# Patient Record
Sex: Female | Born: 1990
Health system: Southern US, Academic
[De-identification: ages and names within clinical notes are randomized; demographics above are authoritative.]

## PROBLEM LIST (undated history)

## (undated) ENCOUNTER — Encounter

## (undated) ENCOUNTER — Inpatient Hospital Stay (HOSPITAL_COMMUNITY): Payer: Self-pay

## (undated) DIAGNOSIS — Z789 Other specified health status: Secondary | ICD-10-CM

## (undated) HISTORY — PX: NO PAST SURGERIES: SHX2092

---

## 2016-02-16 ENCOUNTER — Ambulatory Visit: Admit: 2016-02-16 | Discharge: 2016-02-16 | Attending: Obstetrics & Gynecology

## 2016-02-16 DIAGNOSIS — Z3201 Encounter for pregnancy test, result positive: Secondary | ICD-10-CM

## 2016-02-16 DIAGNOSIS — Z3A00 Weeks of gestation of pregnancy not specified: Principal | ICD-10-CM

## 2016-02-16 DIAGNOSIS — Z3A23 23 weeks gestation of pregnancy: Secondary | ICD-10-CM

## 2016-02-16 NOTE — Progress Notes
Department of Obstetrics & Gynecology    NEW OB WORKUP     Name: Rachael Roberts   MRN: [id number]   DOB:  1991/01/28   Date of Service: 02/16/2016     Payor: [employer] / Plan: [facility]ICAID [facility] / Product Type: HMO/POS /     Reason for Visit:  Initial OB Workup    HPI: Rachael BraveRebecca Anne Roberts is a 25 y.o. female who is being seen today for her Initial OB Nurse Visit.    Urine Pregnancy Test:  Positive    LMP: Patient's last menstrual period was 09/05/2015.    EGA:  1821w3d    Labs sent to:  [facility]    Allergies:  Pcn [penicillins]    Vitals:  BP 119/60 - Pulse 76 - Temp 36.8 ?C (98.3 ?F) - Resp 18 - Ht 1.753 m (5\' 9" ) - Wt 123 kg (271 lb 1.6 oz) - LMP 09/05/2015 - BMI 40.03 kg/m2      OB forms given to patient:   Health Start Form:  yes  NICA Form has been read, signed and is complete:  yes      Loma Sousahristina Tootle, MA

## 2016-02-16 NOTE — Progress Notes
LABS DRAWN    Name: Rachael BraveRebecca Anne Roberts   MRN: [id number]   Date of Service: 02/16/2016     Payor: MEDICAID [facility] / Plan: [facility]ICAID [facility] / Product Type: HMO/POS /     Reason for Visit:   Chief Complaint   Patient presents with   ? Initial Prenatal Visit   ? Pregnancy Test       HPI:  Rachael Roberts was seen today for initial prenatal visit and pregnancy test.    Diagnoses and all orders for this visit:    Weeks of gestation of pregnancy not specified  -     ABO/Rh; Future  -     Antibody screen; Future  -     CBC and Differential; Future  -     Hepatitis B Surface Antigen; Future  -     Culture, Urine; Future  -     Hepatitis C Antibody; Future  -     HIV 1/2 ANTIGEN/ANTIBODY,4TH GEN W/REFLX; Future  -     RPR; Future  -     Rubella Ab IGG; Future  -     Sickle Cell Screen; Future  -     Urinalysis W Microscopy; Future  -     POCT Urine Pregnancy        Loma Sousahristina Tootle, MA

## 2016-02-16 NOTE — Progress Notes
Labs only.  I was on the premises and available.    No teaching statement needed.

## 2016-03-09 ENCOUNTER — Encounter: Attending: Advanced Practice Midwife

## 2016-04-24 NOTE — L&D Delivery Note (Signed)
26 y.o. [id number] at [redacted]w[redacted]d delivered a viable female infant in cephalic, ROA position. No nuchal cord. Left anterior shoulder delivered with ease. 60 sec delayed cord clamping. Cord clamped x2 and cut. Placenta delivered spontaneously intact, with 3VC. Fundus firm on exam with massage and pitocin. Good hemostasis noted.  Anesthesia: Epidural Laceration: None Suture: N/A Good hemostasis noted. EBL: 150cc  Mom and baby recovering in LDR.    Apgars:  APGAR (1 MIN): 8 APGAR (5 MINS): 9    Weight: Pending skin to skin    Jen Mow, DO OB Fellow [facility] for [employer], [facility] 08/11/2016, 11:25 AM

## 2016-06-22 ENCOUNTER — Inpatient Hospital Stay (HOSPITAL_COMMUNITY)
Admission: EM | Admit: 2016-06-22 | Discharge: 2016-06-23 | Disposition: A | Discharge status: Other Acute Inpatient Hospital | Payer: Medicaid - Out of State | Attending: Obstetrics & Gynecology | Admitting: Obstetrics & Gynecology

## 2016-06-22 ENCOUNTER — Encounter (HOSPITAL_COMMUNITY): Payer: Self-pay

## 2016-06-22 DIAGNOSIS — O99333 Smoking (tobacco) complicating pregnancy, third trimester: Secondary | ICD-10-CM | POA: Insufficient documentation

## 2016-06-22 DIAGNOSIS — Y929 Unspecified place or not applicable: Secondary | ICD-10-CM | POA: Insufficient documentation

## 2016-06-22 DIAGNOSIS — N939 Abnormal uterine and vaginal bleeding, unspecified: Secondary | ICD-10-CM

## 2016-06-22 DIAGNOSIS — X58XXXA Exposure to other specified factors, initial encounter: Secondary | ICD-10-CM | POA: Insufficient documentation

## 2016-06-22 DIAGNOSIS — Z3A33 33 weeks gestation of pregnancy: Secondary | ICD-10-CM | POA: Diagnosis not present

## 2016-06-22 DIAGNOSIS — A630 Anogenital (venereal) warts: Secondary | ICD-10-CM | POA: Diagnosis not present

## 2016-06-22 DIAGNOSIS — Y999 Unspecified external cause status: Secondary | ICD-10-CM | POA: Insufficient documentation

## 2016-06-22 DIAGNOSIS — S3141XA Laceration without foreign body of vagina and vulva, initial encounter: Secondary | ICD-10-CM | POA: Diagnosis not present

## 2016-06-22 DIAGNOSIS — O98319 Other infections with a predominantly sexual mode of transmission complicating pregnancy, unspecified trimester: Secondary | ICD-10-CM

## 2016-06-22 DIAGNOSIS — F17200 Nicotine dependence, unspecified, uncomplicated: Secondary | ICD-10-CM | POA: Insufficient documentation

## 2016-06-22 DIAGNOSIS — Y9389 Activity, other specified: Secondary | ICD-10-CM | POA: Insufficient documentation

## 2016-06-22 HISTORY — DX: Other specified health status: Z78.9

## 2016-06-22 LAB — CBC WITH DIFFERENTIAL/PLATELET
BASOS ABS: 0 10*3/uL (ref 0.0–0.1)
BASOS PCT: 0 %
Eosinophils Absolute: 0.1 10*3/uL (ref 0.0–0.7)
Eosinophils Relative: 1 %
HCT: 34.3 % — ABNORMAL LOW (ref 36.0–46.0)
Hemoglobin: 10.8 g/dL — ABNORMAL LOW (ref 12.0–15.0)
Lymphocytes Relative: 18 %
Lymphs Abs: 1.9 10*3/uL (ref 0.7–4.0)
MCH: 26.1 pg (ref 26.0–34.0)
MCHC: 31.5 g/dL (ref 30.0–36.0)
MCV: 82.9 fL (ref 78.0–100.0)
MONO ABS: 0.7 10*3/uL (ref 0.1–1.0)
Monocytes Relative: 6 %
NEUTROS ABS: 8 10*3/uL — AB (ref 1.7–7.7)
NEUTROS PCT: 75 %
Platelets: 308 10*3/uL (ref 150–400)
RBC: 4.14 MIL/uL (ref 3.87–5.11)
RDW: 14.1 % (ref 11.5–15.5)
WBC: 10.6 10*3/uL — AB (ref 4.0–10.5)

## 2016-06-22 LAB — I-STAT CHEM 8, ED
BUN: 3 mg/dL — ABNORMAL LOW (ref 6–20)
CREATININE: 0.6 mg/dL (ref 0.44–1.00)
Calcium, Ion: 1.12 mmol/L — ABNORMAL LOW (ref 1.15–1.40)
Chloride: 104 mmol/L (ref 101–111)
Glucose, Bld: 78 mg/dL (ref 65–99)
HEMATOCRIT: 32 % — AB (ref 36.0–46.0)
HEMOGLOBIN: 10.9 g/dL — AB (ref 12.0–15.0)
POTASSIUM: 3.2 mmol/L — AB (ref 3.5–5.1)
Sodium: 139 mmol/L (ref 135–145)
TCO2: 22 mmol/L (ref 0–100)

## 2016-06-22 LAB — ABO/RH: ABO/RH(D): A POS

## 2016-06-22 MED ORDER — SODIUM CHLORIDE 0.9 % IV BOLUS (SEPSIS)
1000.0000 mL | Freq: Once | INTRAVENOUS | Status: AC
  Administered 2016-06-22: 1000 mL via INTRAVENOUS

## 2016-06-22 NOTE — Progress Notes (Addendum)
Pt is a G4P2 at [id number] ([facility] with vaginal bleeding. Pt states she was in the shower and passed 3 grape size clots. No vaginal bleeding since then. Perinium is dry, no blood seen. Fetal heart tones 135bpm, multilple 15x15 accelerations no decelerations. Occasional contractions (2UC with irritability over last 50 minutes).   [place] She has not had PNC for 13 weeks.   Dr Erin FullingHarraway Smith notified of patient and complaints. [facility].

## 2016-06-22 NOTE — ED Triage Notes (Addendum)
Pt states she was in the shower and began to have vaginal bleeding and passing clots. Pt states she is [redacted] weeks pregnant. Pt states she can feel fetal movement.

## 2016-06-22 NOTE — ED Notes (Signed)
OB RR RN at bedside as well as Dr. Franklyn Loruch at bedside.

## 2016-06-22 NOTE — MAU Note (Signed)
Reports having cramping that started around 1730 at home.  Took a shower and saw a moderate amount of bright red blood and dark brown grape-sized clots.  [facility].  [place] from [place] and has not received any care since 20 weeks. Reports good fetal movement. And irregular contractions.  Also reports sciatica pain radiating down both her legs.

## 2016-06-22 NOTE — ED Provider Notes (Signed)
[facility]-EMERGENCY DEPT Provider Note   CSN: [id number] Arrival date & time: 06/22/16  1833     History   Chief Complaint Chief Complaint  Patient presents with  . Vaginal Bleeding    HPI Monique Marshall is a 26 y.o. female.  HPI Arrives from home after noticing she was having some vaginal bleeding States she just moved from [facility] and was moving a lot of boxes She did not have trauma, fevers, gush of fluid However, she started having cramps and went to shower and noted clotts of blood, which have subsided since No sob, cp, syncope or lightheadedness Baby has been moving, but less than usual No prenatal care in last 12 weeks due to loss of coverage, prior 5 pregnancies with 2 live children, 1 abortion, 2 miscarriages Dated at 34w by LMP No hx placenta previa per pt  History reviewed. No pertinent past medical history.  There are no active problems to display for this patient.   History reviewed. No pertinent surgical history.  OB History    Gravida Para Term Preterm AB Living   4 2 2   1 2    SAB TAB Ectopic Multiple Live Births   1               Home Medications    Prior to Admission medications   Medication Sig Start Date End Date Taking? Authorizing Provider  acetaminophen (TYLENOL) 325 MG tablet Take 650 mg by mouth every 6 (six) hours as needed (for back pain).   Yes Historical Provider, MD  Prenatal Vit-Fe Fumarate-FA (PRENATAL MULTIVITAMIN) TABS tablet Take 1 tablet by mouth daily.    Yes Historical Provider, MD    Family History History reviewed. No pertinent family history.  Social History Social History  Substance Use Topics  . Smoking status: Current Some Day Smoker  . Smokeless tobacco: Never Used  . Alcohol use No     Allergies   Penicillins   Review of Systems Review of Systems  Constitutional: Negative for fever.  Allergic/Immunologic: Negative for immunocompromised state.  All other systems reviewed and are negative.    Physical  Exam Updated Vital Signs BP 105/55   Pulse 88   Temp 99.2 F (37.3 C) (Oral)   Resp (!) 29   Ht 5\' 9"  (1.753 m)   Wt 113.4 kg   SpO2 100%   BMI 36.92 kg/m   Physical Exam  Constitutional: She is oriented to person, place, and time. She appears well-developed and well-nourished. No distress.  HENT:  Head: Normocephalic and atraumatic.  Eyes: Conjunctivae are normal. Right eye exhibits no discharge. Left eye exhibits no discharge.  Neck: Normal range of motion. Neck supple.  Cardiovascular: Regular rhythm.   Tachy to 120s  Pulmonary/Chest: Effort normal and breath sounds normal. No respiratory distress.  Abdominal: Soft. Bowel sounds are normal. She exhibits no distension and no mass. There is no tenderness. There is no rebound and no guarding.  Neg cvat Neg murphys sign  Musculoskeletal: She exhibits no edema.  Neurological: She is alert and oriented to person, place, and time.  Skin: Skin is warm. No rash noted.  Psychiatric: She has a normal mood and affect.  Nursing note and vitals reviewed.    ED Treatments / Results  Labs (all labs ordered are listed, but only abnormal results are displayed) Labs Reviewed  I-STAT CHEM 8, ED - Abnormal; Notable for the following:       Result Value   Potassium 3.2 (*)  BUN 3 (*)    Calcium, Ion 1.12 (*)    Hemoglobin 10.9 (*)    HCT 32.0 (*)    All other components within normal limits  CBC WITH DIFFERENTIAL/PLATELET  ABO/RH    EKG  EKG Interpretation None       Radiology No results found.  Procedures Procedures (including critical care time)  Medications Ordered in ED Medications  sodium chloride 0.9 % bolus 1,000 mL (1,000 mLs Intravenous New Bag/Given 06/22/16 2025)     Initial Impression / Assessment and Plan / ED Course  I have reviewed the triage vital signs and the nursing notes.  Pertinent labs & imaging results that were available during my care of the patient were reviewed by me and considered in my  medical decision making (see chart for details).     OB triage saw patient, fetus in no acute distress per monitoring CBC, Rh/type ordered and pending 1L fluid given; VS improved and no hypotension, tachycardia improved Not actively bleeding while awaiting results of labs [facility]  Final Clinical Impressions(s) / ED Diagnoses   Final diagnoses:  Vaginal bleeding    New Prescriptions New Prescriptions   No medications on file     Sidney AceAlison Charruf Ren Grasse, MD 06/22/16 2030    Margarita Grizzleanielle Ray, MD 06/23/16 [id number]

## 2016-06-23 ENCOUNTER — Other Ambulatory Visit: Payer: Self-pay | Admitting: Advanced Practice Midwife

## 2016-06-23 ENCOUNTER — Telehealth (HOSPITAL_COMMUNITY): Payer: Self-pay

## 2016-06-23 DIAGNOSIS — O98813 Other maternal infectious and parasitic diseases complicating pregnancy, third trimester: Secondary | ICD-10-CM

## 2016-06-23 DIAGNOSIS — M545 Low back pain: Secondary | ICD-10-CM

## 2016-06-23 DIAGNOSIS — O208 Other hemorrhage in early pregnancy: Secondary | ICD-10-CM

## 2016-06-23 DIAGNOSIS — A749 Chlamydial infection, unspecified: Secondary | ICD-10-CM

## 2016-06-23 DIAGNOSIS — O9989 Other specified diseases and conditions complicating pregnancy, childbirth and the puerperium: Secondary | ICD-10-CM

## 2016-06-23 LAB — GC/CHLAMYDIA PROBE AMP (~~LOC~~) NOT AT ARMC
Chlamydia: POSITIVE — AB
Neisseria Gonorrhea: NEGATIVE

## 2016-06-23 LAB — WET PREP, GENITAL
Clue Cells Wet Prep HPF POC: NONE SEEN
SPERM: NONE SEEN
TRICH WET PREP: NONE SEEN
YEAST WET PREP: NONE SEEN

## 2016-06-23 MED ORDER — AZITHROMYCIN 500 MG PO TABS: 1000.0000 mg | ORAL_TABLET | Freq: Once | ORAL | 0 refills | Status: AC

## 2016-06-23 MED ORDER — CYCLOBENZAPRINE HCL 5 MG PO TABS: 5.0000 mg | ORAL_TABLET | Freq: Three times a day (TID) | ORAL | 0 refills | Status: DC | PRN

## 2016-06-23 NOTE — Progress Notes (Signed)
Rx Zithromax for chlamydia  Aviva SignsMarie L Whit Bruni, CNM

## 2016-06-23 NOTE — Discharge Instructions (Signed)
Back Pain in Pregnancy Back pain during pregnancy is common. Back pain may be caused by several factors that are related to changes during your pregnancy. Follow these instructions at home: Managing pain, stiffness, and swelling   If directed, apply ice for sudden (acute) back pain.  Put ice in a plastic bag.  Place a towel between your skin and the bag.  Leave the ice on for 20 minutes, 2-3 times per day.  If directed, apply heat to the affected area before you exercise:  Place a towel between your skin and the heat pack or heating pad.  Leave the heat on for 20-30 minutes.  Remove the heat if your skin turns bright red. This is especially important if you are unable to feel pain, heat, or cold. You may have a greater risk of getting burned. Activity   Exercise as told by your health care provider. Exercising is the best way to prevent or manage back pain.  Listen to your body when lifting. If lifting hurts, ask for help or bend your knees. This uses your leg muscles instead of your back muscles.  Squat down when picking up something from the floor. Do not bend over.  Only use bed rest as told by your health care provider. Bed rest should only be used for the most severe episodes of back pain. Standing, Sitting, and Lying Down   Do not stand in one place for long periods of time.  Use good posture when sitting. Make sure your head rests over your shoulders and is not hanging forward. Use a pillow on your lower back if necessary.  Try sleeping on your side, preferably the left side, with a pillow or two between your legs. If you are sore after a night's rest, your bed may be too soft. A firm mattress may provide more support for your back during pregnancy. General instructions   Do not wear high heels.  Eat a healthy diet. Try to gain weight within your health care provider's recommendations.  Use a maternity girdle, elastic sling, or back brace as told by your health care  provider.  Take over-the-counter and prescription medicines only as told by your health care provider.  Keep all follow-up visits as told by your health care provider. This is important. This includes any visits with any specialists, such as a physical therapist. Contact a health care provider if:  Your back pain interferes with your daily activities.  You have increasing pain in other parts of your body. Get help right away if:  You develop numbness, tingling, weakness, or problems with the use of your arms or legs.  You develop severe back pain that is not controlled with medicine.  You have a sudden change in bowel or bladder control.  You develop shortness of breath, dizziness, or you faint.  You develop nausea, vomiting, or sweating.  You have back pain that is a rhythmic, cramping pain similar to labor pains. Labor pain is usually 1-2 minutes apart, lasts for about 1 minute, and involves a bearing down feeling or pressure in your pelvis.  You have back pain and your water breaks or you have vaginal bleeding.  You have back pain or numbness that travels down your leg.  Your back pain developed after you fell.  You develop pain on one side of your back.  You see blood in your urine.  You develop skin blisters in the area of your back pain. This information is not intended to replace   advice given to you by your health care provider. Make sure you discuss any questions you have with your health care provider. Document Released: 07/19/2005 Document Revised: 09/16/2015 Document Reviewed: 12/23/2014 Elsevier Interactive Patient Education  2017 Elsevier Inc.  

## 2016-06-23 NOTE — Telephone Encounter (Signed)

## 2016-06-23 NOTE — MAU Provider Note (Signed)
  History     CSN: [id number]  Arrival date and time: 06/22/16 1833   None     Chief Complaint  Patient presents with  . Vaginal Bleeding   HPI Pt is a [id number] [place] She reports that they just moved here yesterday. She reports that she presented today due to back pain. She also c/o bleeding after showering today. She reports small clots at that time. She was last sexually active 1 weeks prev.  She did shave her vulva earlier today.  She reports +FM, occ No ctx, No LOF.   She [facility] [place] [facility] but, she does not remember the physicians name.  She reports that her last PNV was at around 20 weeks.  She denies dysuria or urinary freq.  She reports that her back pain is lower back.        Past Medical History:  Diagnosis Date  . Medical history non-contributory     Past Surgical History:  Procedure Laterality Date  . NO PAST SURGERIES      History reviewed. No pertinent family history.  Social History  Substance Use Topics  . Smoking status: Current Some Day Smoker    Types: Cigarettes  . Smokeless tobacco: Never Used  . Alcohol use No    Allergies:  Allergies  Allergen Reactions  . Penicillins Anaphylaxis and Hives    Has patient had a PCN reaction causing immediate rash, facial/tongue/throat swelling, SOB or lightheadedness with hypotension: Yes Has patient had a PCN reaction causing severe rash involving mucus membranes or skin necrosis: No Has patient had a PCN reaction that required hospitalization Yes Has patient had a PCN reaction occurring within the last 10 years: Yes If all of the above answers are "NO", then may proceed with Cephalosporin use.      Prescriptions Prior to Admission  Medication Sig Dispense Refill Last Dose  . acetaminophen (TYLENOL) 325 MG tablet Take 650 mg by mouth every 6 (six) hours as needed (for back pain).   06/22/2016 at Unknown time  . Prenatal Vit-Fe Fumarate-FA (PRENATAL  MULTIVITAMIN) TABS tablet Take 1 tablet by mouth daily.    06/22/2016 at am    Review of Systems Physical Exam   Blood pressure 131/81, pulse 92, temperature 98.3 F (36.8 C), temperature source Oral, resp. rate 20, height 5\' 9"  (1.753 m), weight 250 lb (113.4 kg), SpO2 100 %.  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 There is a laceration on the right side of the vulva that is superficial but, is still bleeding. There is no blood in the vault.  The cervix is long and closed and post. Cervix: no lesion; no mucopurulent d/c Uterus: gravid; nontender Adnexa: no masses; non tender  Back: no CVAT   FHR: reactive; CAT I    MAU Course  Procedures  MDM [facility]  Wet mount : neg  Assessment and Plan  Low back pain in pregnancy   Will discharge to home on  Flexeril 5mg  tid  Bleeding-     No vaginal bleeding noted    The superficial laceration is not actively bleeding     [facility]       Emidio Warrell Harraway-Smith 06/23/2016, 12:20 AM

## 2016-06-26 ENCOUNTER — Telehealth: Payer: Self-pay | Admitting: Obstetrics and Gynecology

## 2016-06-26 MED ORDER — AZITHROMYCIN 250 MG PO TABS: 1000.0000 mg | ORAL_TABLET | Freq: Once | ORAL | 0 refills | Status: AC

## 2016-06-26 NOTE — Telephone Encounter (Signed)
Azithromycin called into pharmacy

## 2016-07-04 ENCOUNTER — Encounter: Payer: Medicaid - Out of State | Admitting: Advanced Practice Midwife

## 2016-07-06 ENCOUNTER — Other Ambulatory Visit (HOSPITAL_COMMUNITY)
Admission: RE | Admit: 2016-07-06 | Discharge: 2016-07-06 | Disposition: A | Discharge status: Home / Self Care | Payer: Medicaid - Out of State | Source: Ambulatory Visit | Attending: Medical | Admitting: Medical

## 2016-07-06 ENCOUNTER — Ambulatory Visit (INDEPENDENT_AMBULATORY_CARE_PROVIDER_SITE_OTHER): Payer: Medicaid - Out of State | Admitting: Medical

## 2016-07-06 ENCOUNTER — Encounter: Payer: Self-pay | Admitting: Medical

## 2016-07-06 VITALS — BP 110/78 | HR 124 | Wt 302.3 lb

## 2016-07-06 DIAGNOSIS — O98313 Other infections with a predominantly sexual mode of transmission complicating pregnancy, third trimester: Secondary | ICD-10-CM | POA: Diagnosis not present

## 2016-07-06 DIAGNOSIS — Z3493 Encounter for supervision of normal pregnancy, unspecified, third trimester: Secondary | ICD-10-CM

## 2016-07-06 DIAGNOSIS — Z3A35 35 weeks gestation of pregnancy: Secondary | ICD-10-CM | POA: Diagnosis not present

## 2016-07-06 DIAGNOSIS — O0933 Supervision of pregnancy with insufficient antenatal care, third trimester: Secondary | ICD-10-CM

## 2016-07-06 DIAGNOSIS — Z113 Encounter for screening for infections with a predominantly sexual mode of transmission: Secondary | ICD-10-CM | POA: Diagnosis not present

## 2016-07-06 DIAGNOSIS — Z3403 Encounter for supervision of normal first pregnancy, third trimester: Secondary | ICD-10-CM | POA: Diagnosis not present

## 2016-07-06 NOTE — Progress Notes (Signed)
   PRENATAL VISIT NOTE  Subjective:  Monique Marshall is a 26 y.o. [phone] at 1622w5d being seen today for her initial prenatal visit with us. She has had very limited prenatal care in [place] prior to moving here. She denies complication with this or previous pregnancies. She denies chronic medical problems.  She is currently monitored for the following issues for this low-risk pregnancy and has Chlamydia; Supervision of low-risk pregnancy, third trimester; and Limited prenatal care in third trimester on her problem list.  Patient reports no complaints.  Contractions: Not present. Vag. Bleeding: None.  Movement: Present. Denies leaking of fluid.   The following portions of the patient's history were reviewed and updated as appropriate: allergies, current medications, past family history, past medical history, past social history, past surgical history and problem list. Problem list updated.  Objective:   Vitals:   07/06/16 0809  BP: 110/78  Pulse: (!) 124  Weight: (!) 302 lb 4.8 oz (137.1 kg)    Fetal Status: Fetal Heart Rate (bpm): 144 Fundal Height: 37 cm Movement: Present     General:  Alert, oriented and cooperative. Patient is in no acute distress.  Skin: Skin is warm and dry. No rash noted.   Cardiovascular: Normal heart rate noted  Respiratory: Normal respiratory effort, no problems with respiration noted  Abdomen: Soft, gravid, appropriate for gestational age. Pain/Pressure: Absent     Pelvic:  Cervical exam performed Dilation: Closed Effacement (%): Thick Station: -3 Normal vaginal mucosa, cervix with normal contour, mild erythema, multiparous appearance, no lesions, mild friability. Pap smear obtained.   Extremities: Normal range of motion.  Edema: Trace  Mental Status: Normal mood and affect. Normal behavior. Normal judgment and thought content.   Assessment and Plan:  Pregnancy: [id number] at 2022w5d  1. Supervision of low-risk first pregnancy, third trimester - Obstetric  Panel, Including HIV - Cytology - PAP - Cystic Fibrosis Mutation 97 - Hemoglobinopathy Evaluation - Glucose Tolerance, 2 Hours w/1 Hour - Strep Gp B Culture+Rflx - Culture, OB Urine - US MFM OB COMP + 14 WK; Scheduled - Desired BTL - consent forms signed today, patient advised of need for 30 days for consent and possibility that it will not be covered due to out-of-state Medicaid  Preterm labor symptoms and general obstetric precautions including but not limited to vaginal bleeding, contractions, leaking of fluid and fetal movement were reviewed in detail with the patient. Please refer to After Visit Summary for other counseling recommendations.  Return in about 1 week (around 07/13/2016) for LOB.   Marny LowensteinJulie N Elorah Dewing, PA-C

## 2016-07-06 NOTE — Patient Instructions (Signed)
Fetal Movement Counts Patient Name: ________________________________________________ Patient Due Date: ____________________ What is a fetal movement count? A fetal movement count is the number of times that you feel your baby move during a certain amount of time. This may also be called a fetal kick count. A fetal movement count is recommended for every pregnant woman. You may be asked to start counting fetal movements as early as week 28 of your pregnancy. Pay attention to when your baby is most active. You may notice your baby's sleep and wake cycles. You may also notice things that make your baby move more. You should do a fetal movement count:  When your baby is normally most active.  At the same time each day. A good time to count movements is while you are resting, after having something to eat and drink. How do I count fetal movements? 1. Find a quiet, comfortable area. Sit, or lie down on your side. 2. Write down the date, the start time and stop time, and the number of movements that you felt between those two times. Take this information with you to your health care visits. 3. For 2 hours, count kicks, flutters, swishes, rolls, and jabs. You should feel at least 10 movements during 2 hours. 4. You may stop counting after you have felt 10 movements. 5. If you do not feel 10 movements in 2 hours, have something to eat and drink. Then, keep resting and counting for 1 hour. If you feel at least 4 movements during that hour, you may stop counting. Contact a health care provider if:  You feel fewer than 4 movements in 2 hours.  Your baby is not moving like he or she usually does. Date: ____________ Start time: ____________ Stop time: ____________ Movements: ____________ Date: ____________ Start time: ____________ Stop time: ____________ Movements: ____________ Date: ____________ Start time: ____________ Stop time: ____________ Movements: ____________ Date: ____________ Start time:  ____________ Stop time: ____________ Movements: ____________ Date: ____________ Start time: ____________ Stop time: ____________ Movements: ____________ Date: ____________ Start time: ____________ Stop time: ____________ Movements: ____________ Date: ____________ Start time: ____________ Stop time: ____________ Movements: ____________ Date: ____________ Start time: ____________ Stop time: ____________ Movements: ____________ Date: ____________ Start time: ____________ Stop time: ____________ Movements: ____________ This information is not intended to replace advice given to you by your health care provider. Make sure you discuss any questions you have with your health care provider. Document Released: 05/10/2006 Document Revised: 12/08/2015 Document Reviewed: 05/20/2015 Elsevier Interactive Patient Education  2017 [employer] Braxton Hicks Contractions Contractions of the uterus can occur throughout pregnancy, but they are not always a sign that you are in labor. You may have practice contractions called Braxton Hicks contractions. These false labor contractions are sometimes confused with true labor. What are Braxton Hicks contractions? Braxton Hicks contractions are tightening movements that occur in the muscles of the uterus before labor. Unlike true labor contractions, these contractions do not result in opening (dilation) and thinning of the cervix. Toward the end of pregnancy (32-34 weeks), Braxton Hicks contractions can happen more often and may become stronger. These contractions are sometimes difficult to tell apart from true labor because they can be very uncomfortable. You should not feel embarrassed if you go to the hospital with false labor. Sometimes, the only way to tell if you are in true labor is for your health care provider to look for changes in the cervix. The health care provider will do a physical exam and may monitor your contractions. If you   are not in true labor, the exam  should show that your cervix is not dilating and your water has not broken. If there are no prenatal problems or other health problems associated with your pregnancy, it is completely safe for you to be sent home with false labor. You may continue to have Braxton Hicks contractions until you go into true labor. How can I tell the difference between true labor and false labor?  Differences  False labor  Contractions last 30-70 seconds.: Contractions are usually shorter and not as strong as true labor contractions.  Contractions become very regular.: Contractions are usually irregular.  Discomfort is usually felt in the top of the uterus, and it spreads to the lower abdomen and low back.: Contractions are often felt in the front of the lower abdomen and in the groin.  Contractions do not go away with walking.: Contractions may go away when you walk around or change positions while lying down.  Contractions usually become more intense and increase in frequency.: Contractions get weaker and are shorter-lasting as time goes on.  The cervix dilates and gets thinner.: The cervix usually does not dilate or become thin. Follow these instructions at home:  Take over-the-counter and prescription medicines only as told by your health care provider.  Keep up with your usual exercises and follow other instructions from your health care provider.  Eat and drink lightly if you think you are going into labor.  If Braxton Hicks contractions are making you uncomfortable:  Change your position from lying down or resting to walking, or change from walking to resting.  Sit and rest in a tub of warm water.  Drink enough fluid to keep your urine clear or pale yellow. Dehydration may cause these contractions.  Do slow and deep breathing several times an hour.  Keep all follow-up prenatal visits as told by your health care provider. This is important. Contact a health care provider if:  You have a  fever.  You have continuous pain in your abdomen. Get help right away if:  Your contractions become stronger, more regular, and closer together.  You have fluid leaking or gushing from your vagina.  You pass blood-tinged mucus (bloody show).  You have bleeding from your vagina.  You have low back pain that you never had before.  You feel your baby's head pushing down and causing pelvic pressure.  Your baby is not moving inside you as much as it used to. Summary  Contractions that occur before labor are called Braxton Hicks contractions, false labor, or practice contractions.  Braxton Hicks contractions are usually shorter, weaker, farther apart, and less regular than true labor contractions. True labor contractions usually become progressively stronger and regular and they become more frequent.  Manage discomfort from Braxton Hicks contractions by changing position, resting in a warm bath, drinking plenty of water, or practicing deep breathing. This information is not intended to replace advice given to you by your health care provider. Make sure you discuss any questions you have with your health care provider. Document Released: 04/10/2005 Document Revised: 02/28/2016 Document Reviewed: 02/28/2016 Elsevier Interactive Patient Education  2017 [employer]  

## 2016-07-08 LAB — CULTURE, OB URINE

## 2016-07-08 LAB — URINE CULTURE, OB REFLEX

## 2016-07-10 LAB — OBSTETRIC PANEL, INCLUDING HIV
Antibody Screen: NEGATIVE
BASOS ABS: 0 10*3/uL (ref 0.0–0.2)
BASOS: 0 %
EOS (ABSOLUTE): 0.1 10*3/uL (ref 0.0–0.4)
Eos: 1 %
HEMOGLOBIN: 10.5 g/dL — AB (ref 11.1–15.9)
HIV Screen 4th Generation wRfx: NONREACTIVE
Hematocrit: 32.1 % — ABNORMAL LOW (ref 34.0–46.6)
Hepatitis B Surface Ag: NEGATIVE
IMMATURE GRANS (ABS): 0 10*3/uL (ref 0.0–0.1)
IMMATURE GRANULOCYTES: 0 %
LYMPHS: 20 %
Lymphocytes Absolute: 1.6 10*3/uL (ref 0.7–3.1)
MCH: 26.9 pg (ref 26.6–33.0)
MCHC: 32.7 g/dL (ref 31.5–35.7)
MCV: 82 fL (ref 79–97)
MONOCYTES: 6 %
MONOS ABS: 0.5 10*3/uL (ref 0.1–0.9)
NEUTROS PCT: 73 %
Neutrophils Absolute: 5.9 10*3/uL (ref 1.4–7.0)
Platelets: 305 10*3/uL (ref 150–379)
RBC: 3.91 x10E6/uL (ref 3.77–5.28)
RDW: 14.6 % (ref 12.3–15.4)
RPR Ser Ql: NONREACTIVE
RUBELLA: 2.73 {index} (ref >0.99)
Rh Factor: POSITIVE
WBC: 8.1 10*3/uL (ref 3.4–10.8)

## 2016-07-10 LAB — HEMOGLOBINOPATHY EVALUATION
FERRITIN: 5 ng/mL — AB (ref 15–150)
HGB C: 0 %
HGB F QUANT: 0 % (ref 0.0–2.0)
HGB SOLUBILITY: NEGATIVE
Hgb A2 Quant: 2.1 % (ref 1.8–3.2)
Hgb A: 97.9 % (ref 96.4–98.8)
Hgb S: 0 %
Hgb Variant: 0 %

## 2016-07-10 LAB — GLUCOSE TOLERANCE, 2 HOURS W/ 1HR
GLUCOSE, 2 HOUR: 80 mg/dL (ref 65–152)
Glucose, 1 hour: 121 mg/dL (ref 65–179)
Glucose, Fasting: 100 mg/dL — ABNORMAL HIGH (ref 65–91)

## 2016-07-10 LAB — STREP GP B CULTURE+RFLX: STREP GP B CULTURE+RFLX: NEGATIVE

## 2016-07-11 LAB — CYSTIC FIBROSIS MUTATION 97: GENE DIS ANAL CARRIER INTERP BLD/T-IMP: NOT DETECTED

## 2016-07-12 LAB — CYTOLOGY - PAP
Chlamydia: POSITIVE — AB
Diagnosis: NEGATIVE
NEISSERIA GONORRHEA: NEGATIVE

## 2016-07-13 ENCOUNTER — Telehealth: Payer: Self-pay

## 2016-07-13 MED ORDER — AZITHROMYCIN 250 MG PO TABS: 250.0000 mg | ORAL_TABLET | Freq: Once | ORAL | 0 refills | Status: AC

## 2016-07-13 NOTE — Telephone Encounter (Signed)
Patient tested positive for Chlamydia. Called patient to make her aware of results no answer. There was no answer I have left a message for patient to call us back.

## 2016-07-13 NOTE — Telephone Encounter (Signed)
Called patient and informed her of results. Patient states she was already treated 2 weeks ago and her partner was treated. Patient states she waited one week before having intercourse. Advised patient to retreat herself and her partner and to not have sex until we obtain a negative result. Needs TOC around 40 weeks. Patient verbalized understanding to all & had no questions

## 2016-07-14 ENCOUNTER — Other Ambulatory Visit: Payer: Self-pay | Admitting: Medical

## 2016-07-14 ENCOUNTER — Ambulatory Visit (HOSPITAL_COMMUNITY)
Admission: RE | Admit: 2016-07-14 | Discharge: 2016-07-14 | Disposition: A | Discharge status: Home / Self Care | Payer: Medicaid - Out of State | Source: Ambulatory Visit | Attending: Medical | Admitting: Medical

## 2016-07-14 DIAGNOSIS — Z3A36 36 weeks gestation of pregnancy: Secondary | ICD-10-CM | POA: Diagnosis not present

## 2016-07-14 DIAGNOSIS — O0933 Supervision of pregnancy with insufficient antenatal care, third trimester: Secondary | ICD-10-CM

## 2016-07-14 DIAGNOSIS — Z3689 Encounter for other specified antenatal screening: Secondary | ICD-10-CM

## 2016-07-14 DIAGNOSIS — Z3403 Encounter for supervision of normal first pregnancy, third trimester: Secondary | ICD-10-CM

## 2016-07-18 ENCOUNTER — Ambulatory Visit (INDEPENDENT_AMBULATORY_CARE_PROVIDER_SITE_OTHER): Payer: Self-pay | Admitting: Advanced Practice Midwife

## 2016-07-18 VITALS — BP 124/78 | HR 118 | Wt 305.1 lb

## 2016-07-18 DIAGNOSIS — O26893 Other specified pregnancy related conditions, third trimester: Secondary | ICD-10-CM

## 2016-07-18 DIAGNOSIS — Z3403 Encounter for supervision of normal first pregnancy, third trimester: Secondary | ICD-10-CM

## 2016-07-18 DIAGNOSIS — Z3483 Encounter for supervision of other normal pregnancy, third trimester: Secondary | ICD-10-CM

## 2016-07-18 DIAGNOSIS — R102 Pelvic and perineal pain: Secondary | ICD-10-CM

## 2016-07-18 NOTE — Patient Instructions (Signed)

## 2016-07-18 NOTE — Progress Notes (Signed)
   PRENATAL VISIT NOTE  Subjective:  Monique Marshall is a 26 y.o. [phone] at 5234w3d being seen today for ongoing prenatal care.  She is currently monitored for the following issues for this low-risk pregnancy and has Chlamydia; Supervision of low-risk pregnancy, third trimester; and Limited prenatal care in third trimester on her problem list.  Patient reports backache and pelvic pressure.  Contractions: Not present. Vag. Bleeding: None.  Movement: Present. Denies leaking of fluid.   The following portions of the patient's history were reviewed and updated as appropriate: allergies, current medications, past family history, past medical history, past social history, past surgical history and problem list. Problem list updated.  Objective:   Vitals:   07/18/16 0947  BP: 124/78  Pulse: (!) 118  Weight: (!) 305 lb 1.6 oz (138.4 kg)    Fetal Status: Fetal Heart Rate (bpm): 164 Fundal Height: 38 cm Movement: Present  Presentation: Vertex  General:  Alert, oriented and cooperative. Patient is in no acute distress.  Skin: Skin is warm and dry. No rash noted.   Cardiovascular: Normal heart rate noted  Respiratory: Normal respiratory effort, no problems with respiration noted  Abdomen: Soft, gravid, appropriate for gestational age. Pain/Pressure: Present     Pelvic:  Cervical exam performed Dilation: 2.5 Effacement (%): 30 Station: -2  Extremities: Normal range of motion.  Edema: None  Mental Status: Normal mood and affect. Normal behavior. Normal judgment and thought content.   Assessment and Plan:  Pregnancy: [id number] at 4334w3d  1. Supervision of low-risk first pregnancy, third trimester  - GC positive at last visit, only 2 weeks from previous treatment.  Retreated and partner retreated. Pt reports no intercourse since retreatment on 3/22.  Too early for TOC today, plan to retest at 40 weeks if still pregnant. - GBS done at last visit  2. Pelvic pain affecting pregnancy in third  trimester, antepartum ---rest/ice/heat/tylenol/pregnancy support belt for pain.  Term labor symptoms and general obstetric precautions including but not limited to vaginal bleeding, contractions, leaking of fluid and fetal movement were reviewed in detail with the patient. Please refer to After Visit Summary for other counseling recommendations.  Return in about 1 week (around 07/25/2016).   Hurshel PartyLisa A Leftwich-Kirby, CNM

## 2016-07-25 ENCOUNTER — Ambulatory Visit (INDEPENDENT_AMBULATORY_CARE_PROVIDER_SITE_OTHER): Payer: Self-pay | Admitting: Medical

## 2016-07-25 VITALS — BP 113/85 | HR 115 | Wt 306.0 lb

## 2016-07-25 DIAGNOSIS — Z3403 Encounter for supervision of normal first pregnancy, third trimester: Secondary | ICD-10-CM

## 2016-07-25 DIAGNOSIS — Z3483 Encounter for supervision of other normal pregnancy, third trimester: Secondary | ICD-10-CM

## 2016-07-25 NOTE — Patient Instructions (Signed)
Braxton Hicks Contractions °Contractions of the uterus can occur throughout pregnancy, but they are not always a sign that you are in labor. You may have practice contractions called Braxton Hicks contractions. These false labor contractions are sometimes confused with true labor. °What are Braxton Hicks contractions? °Braxton Hicks contractions are tightening movements that occur in the muscles of the uterus before labor. Unlike true labor contractions, these contractions do not result in opening (dilation) and thinning of the cervix. Toward the end of pregnancy (32-34 weeks), Braxton Hicks contractions can happen more often and may become stronger. These contractions are sometimes difficult to tell apart from true labor because they can be very uncomfortable. You should not feel embarrassed if you go to the hospital with false labor. °Sometimes, the only way to tell if you are in true labor is for your health care provider to look for changes in the cervix. The health care provider will do a physical exam and may monitor your contractions. If you are not in true labor, the exam should show that your cervix is not dilating and your water has not broken. °If there are no prenatal problems or other health problems associated with your pregnancy, it is completely safe for you to be sent home with false labor. You may continue to have Braxton Hicks contractions until you go into true labor. °How can I tell the difference between true labor and false labor? °· Differences °¨ False labor °¨ Contractions last 30-70 seconds.: Contractions are usually shorter and not as strong as true labor contractions. °¨ Contractions become very regular.: Contractions are usually irregular. °¨ Discomfort is usually felt in the top of the uterus, and it spreads to the lower abdomen and low back.: Contractions are often felt in the front of the lower abdomen and in the groin. °¨ Contractions do not go away with walking.: Contractions may  go away when you walk around or change positions while lying down. °¨ Contractions usually become more intense and increase in frequency.: Contractions get weaker and are shorter-lasting as time goes on. °¨ The cervix dilates and gets thinner.: The cervix usually does not dilate or become thin. °Follow these instructions at home: °¨ Take over-the-counter and prescription medicines only as told by your health care provider. °¨ Keep up with your usual exercises and follow other instructions from your health care provider. °¨ Eat and drink lightly if you think you are going into labor. °¨ If Braxton Hicks contractions are making you uncomfortable: °¨ Change your position from lying down or resting to walking, or change from walking to resting. °¨ Sit and rest in a tub of warm water. °¨ Drink enough fluid to keep your urine clear or pale yellow. Dehydration may cause these contractions. °¨ Do slow and deep breathing several times an hour. °¨ Keep all follow-up prenatal visits as told by your health care provider. This is important. °Contact a health care provider if: °¨ You have a fever. °¨ You have continuous pain in your abdomen. °Get help right away if: °¨ Your contractions become stronger, more regular, and closer together. °¨ You have fluid leaking or gushing from your vagina. °¨ You pass blood-tinged mucus (bloody show). °¨ You have bleeding from your vagina. °¨ You have low back pain that you never had before. °¨ You feel your baby’s head pushing down and causing pelvic pressure. °¨ Your baby is not moving inside you as much as it used to. °Summary °¨ Contractions that occur before labor are   called Braxton Hicks contractions, false labor, or practice contractions. °¨ Braxton Hicks contractions are usually shorter, weaker, farther apart, and less regular than true labor contractions. True labor contractions usually become progressively stronger and regular and they become more frequent. °¨ Manage discomfort from  Braxton Hicks contractions by changing position, resting in a warm bath, drinking plenty of water, or practicing deep breathing. °This information is not intended to replace advice given to you by your health care provider. Make sure you discuss any questions you have with your health care provider. °Document Released: 04/10/2005 Document Revised: 02/28/2016 Document Reviewed: 02/28/2016 °Elsevier Interactive Patient Education © 2017 [employer] °Fetal Movement Counts °Patient Name: ________________________________________________ Patient Due Date: ____________________ °What is a fetal movement count? °A fetal movement count is the number of times that you feel your baby move during a certain amount of time. This may also be called a fetal kick count. A fetal movement count is recommended for every pregnant woman. You may be asked to start counting fetal movements as early as week 28 of your pregnancy. °Pay attention to when your baby is most active. You may notice your baby's sleep and wake cycles. You may also notice things that make your baby move more. You should do a fetal movement count: °· When your baby is normally most active. °· At the same time each day. °A good time to count movements is while you are resting, after having something to eat and drink. °How do I count fetal movements? °1. Find a quiet, comfortable area. Sit, or lie down on your side. °2. Write down the date, the start time and stop time, and the number of movements that you felt between those two times. Take this information with you to your health care visits. °3. For 2 hours, count kicks, flutters, swishes, rolls, and jabs. You should feel at least 10 movements during 2 hours. °4. You may stop counting after you have felt 10 movements. °5. If you do not feel 10 movements in 2 hours, have something to eat and drink. Then, keep resting and counting for 1 hour. If you feel at least 4 movements during that hour, you may stop  counting. °Contact a health care provider if: °· You feel fewer than 4 movements in 2 hours. °· Your baby is not moving like he or she usually does. °Date: ____________ Start time: ____________ Stop time: ____________ Movements: ____________ °Date: ____________ Start time: ____________ Stop time: ____________ Movements: ____________ °Date: ____________ Start time: ____________ Stop time: ____________ Movements: ____________ °Date: ____________ Start time: ____________ Stop time: ____________ Movements: ____________ °Date: ____________ Start time: ____________ Stop time: ____________ Movements: ____________ °Date: ____________ Start time: ____________ Stop time: ____________ Movements: ____________ °Date: ____________ Start time: ____________ Stop time: ____________ Movements: ____________ °Date: ____________ Start time: ____________ Stop time: ____________ Movements: ____________ °Date: ____________ Start time: ____________ Stop time: ____________ Movements: ____________ °This information is not intended to replace advice given to you by your health care provider. Make sure you discuss any questions you have with your health care provider. °Document Released: 05/10/2006 Document Revised: 12/08/2015 Document Reviewed: 05/20/2015 °Elsevier Interactive Patient Education © 2017 [employer] ° °

## 2016-07-25 NOTE — Progress Notes (Signed)
   PRENATAL VISIT NOTE  Subjective:  Monique Marshall is a 26 y.o. [phone] at [redacted]w[redacted]d being seen today for ongoing prenatal care.  She is currently monitored for the following issues for this low-risk pregnancy and has Chlamydia; Supervision of low-risk pregnancy, third trimester; and Limited prenatal care in third trimester on her problem list.  Patient reports no complaints.  Contractions: Irregular. Vag. Bleeding: None.  Movement: Present. Denies leaking of fluid.   The following portions of the patient's history were reviewed and updated as appropriate: allergies, current medications, past family history, past medical history, past social history, past surgical history and problem list. Problem list updated.  Objective:   Vitals:   07/25/16 1304  BP: 113/85  Pulse: (!) 115  Weight: (!) 306 lb (138.8 kg)    Fetal Status: Fetal Heart Rate (bpm): 156 Fundal Height: 40 cm Movement: Present  Presentation: Vertex  General:  Alert, oriented and cooperative. Patient is in no acute distress.  Skin: Skin is warm and dry. No rash noted.   Cardiovascular: Normal heart rate noted  Respiratory: Normal respiratory effort, no problems with respiration noted  Abdomen: Soft, gravid, appropriate for gestational age. Pain/Pressure: Absent     Pelvic:  Cervical exam performed Dilation: 2.5 Effacement (%): 50 Station: -2  Extremities: Normal range of motion.  Edema: Trace  Mental Status: Normal mood and affect. Normal behavior. Normal judgment and thought content.   Assessment and Plan:  Pregnancy: [id number] at [redacted]w[redacted]d  1. Supervision of low-risk first pregnancy, third trimester - Patient measuring ahead by ~ 2 weeks, [place] on 07/17/16 EFW 71%tile  Term labor symptoms and general obstetric precautions including but not limited to vaginal bleeding, contractions, leaking of fluid and fetal movement were reviewed in detail with the patient. Please refer to After Visit Summary for other counseling  recommendations.  Return in about 1 week (around 08/01/2016) for LOB with MD.   Marny Lowenstein, PA-C

## 2016-08-02 ENCOUNTER — Encounter: Payer: Self-pay | Admitting: Obstetrics & Gynecology

## 2016-08-02 ENCOUNTER — Ambulatory Visit (INDEPENDENT_AMBULATORY_CARE_PROVIDER_SITE_OTHER): Payer: Self-pay | Admitting: Obstetrics & Gynecology

## 2016-08-02 VITALS — BP 127/81 | HR 90 | Wt 304.0 lb

## 2016-08-02 DIAGNOSIS — O3663X0 Maternal care for excessive fetal growth, third trimester, not applicable or unspecified: Secondary | ICD-10-CM

## 2016-08-02 DIAGNOSIS — O480 Post-term pregnancy: Secondary | ICD-10-CM

## 2016-08-02 DIAGNOSIS — Z3493 Encounter for supervision of normal pregnancy, unspecified, third trimester: Secondary | ICD-10-CM

## 2016-08-02 NOTE — Progress Notes (Signed)
   PRENATAL VISIT NOTE  Subjective:  Monique Marshall is a 26 y.o. [phone] at [redacted]w[redacted]d being seen today for ongoing prenatal care.  She is currently monitored for the following issues for this low-risk pregnancy and has Chlamydia; Supervision of low-risk pregnancy, third trimester; and Limited prenatal care in third trimester on her problem list.  Patient reports occasional contractions. Worried about fetal weight and possibility of needing cesarean section.  Contractions: Irregular. Vag. Bleeding: None.  Movement: Present. Denies leaking of fluid.   The following portions of the patient's history were reviewed and updated as appropriate: allergies, current medications, past family history, past medical history, past social history, past surgical history and problem list. Problem list updated.  Objective:   Vitals:   08/02/16 1423  BP: 127/81  Pulse: 90  Weight: (!) 304 lb (137.9 kg)    Fetal Status: Fetal Heart Rate (bpm): 145 Fundal Height: 42 cm Movement: Present  Presentation: Vertex  General:  Alert, oriented and cooperative. Patient is in no acute distress.  Skin: Skin is warm and dry. No rash noted.   Cardiovascular: Normal heart rate noted  Respiratory: Normal respiratory effort, no problems with respiration noted  Abdomen: Soft, gravid, appropriate for gestational age. Pain/Pressure: Present     Pelvic:  Cervical exam performed Dilation: 3.5 Effacement (%): 50 Station: -2  Extremities: Normal range of motion.  Edema: Trace  Mental Status: Normal mood and affect. Normal behavior. Normal judgment and thought content.   Assessment and Plan:  Pregnancy: [id number] at [redacted]w[redacted]d  1. Large for dates affecting management of mother, third trimester, not applicable or unspecified fetus Still has elevated fundal height, will get updated EFW.  Will only consider cesarean section if  EFW > 5000g. Patient reassured, she desires vaginal delivery.  - [place] MFM OB FOLLOW UP; Future  2. Anticipated  post term pregnancy over 40 weeks IOL to be scheduled at 41 weeks.  BPP next week for antenatal testing.  - [place] MFM FETAL BPP WO NON STRESS; Future  3. Supervision of low-risk pregnancy, third trimester Term labor symptoms and general obstetric precautions including but not limited to vaginal bleeding, contractions, leaking of fluid and fetal movement were reviewed in detail with the patient. Please refer to After Visit Summary for other counseling recommendations.  Return in about 1 week (around 08/09/2016) for OB Visit (LOB) and TOC - Can be Wednesday or Thursday next week.Tereso Newcomer, MD

## 2016-08-02 NOTE — Patient Instructions (Signed)
Return to clinic for any scheduled appointments or obstetric concerns, or go to MAU for evaluation   Labor Induction Labor induction is when steps are taken to cause a pregnant woman to begin the labor process. Most women go into labor on their own between 37 weeks and 42 weeks of the pregnancy. When this does not happen or when there is a medical need, methods may be used to induce labor. Labor induction causes a pregnant woman's uterus to contract. It also causes the cervix to soften (ripen), open (dilate), and thin out (efface). Usually, labor is not induced before 39 weeks of the pregnancy unless there is a problem with the baby or mother. Before inducing labor, your health care provider will consider a number of factors, including the following:  The medical condition of you and the baby.  How many weeks along you are.  The status of the baby's lung maturity.  The condition of the cervix.  The position of the baby.  What are the reasons for labor induction? Labor may be induced for the following reasons:  The health of the baby or mother is at risk.  The pregnancy is overdue by 1 week or more.  The water breaks but labor does not start on its own.  The mother has a health condition or serious illness, such as high blood pressure, infection, placental abruption, or diabetes.  The amniotic fluid amounts are low around the baby.  The baby is distressed.  Convenience or wanting the baby to be born on a certain date is not a reason for inducing labor. What methods are used for labor induction? Several methods of labor induction may be used, such as:  Prostaglandin medicine. This medicine causes the cervix to dilate and ripen. The medicine will also start contractions. It can be taken by mouth or by inserting a suppository into the vagina.  Inserting a thin tube (catheter) with a balloon on the end into the vagina to dilate the cervix. Once inserted, the balloon is expanded  with water, which causes the cervix to open.  Stripping the membranes. Your health care provider separates amniotic sac tissue from the cervix, causing the cervix to be stretched and causing the release of a hormone called progesterone. This may cause the uterus to contract. It is often done during an office visit. You will be sent home to wait for the contractions to begin. You will then come in for an induction.  Breaking the water. Your health care provider makes a hole in the amniotic sac using a small instrument. Once the amniotic sac breaks, contractions should begin. This may still take hours to see an effect.  Medicine to trigger or strengthen contractions. This medicine is given through an IV access tube inserted into a vein in your arm.  All of the methods of induction, besides stripping the membranes, will be done in the hospital. Induction is done in the hospital so that you and the baby can be carefully monitored. How long does it take for labor to be induced? Some inductions can take up to 2-3 days. Depending on the cervix, it usually takes less time. It takes longer when you are induced early in the pregnancy or if this is your first pregnancy. If a mother is still pregnant and the induction has been going on for 2-3 days, either the mother will be sent home or a cesarean delivery will be needed. What are the risks associated with labor induction? Some of the risks   of induction include:  Changes in fetal heart rate, such as too high, too low, or erratic.  Fetal distress.  Chance of infection for the mother and baby.  Increased chance of having a cesarean delivery.  Breaking off (abruption) of the placenta from the uterus (rare).  Uterine rupture (very rare).  When induction is needed for medical reasons, the benefits of induction may outweigh the risks. What are some reasons for not inducing labor? Labor induction should not be done if:  It is shown that your baby does  not tolerate labor.  You have had previous surgeries on your uterus, such as a myomectomy or the removal of fibroids.  Your placenta lies very low in the uterus and blocks the opening of the cervix (placenta previa).  Your baby is not in a head-down position.  The umbilical cord drops down into the birth canal in front of the baby. This could cut off the baby's blood and oxygen supply.  You have had a previous cesarean delivery.  There are unusual circumstances, such as the baby being extremely premature.  This information is not intended to replace advice given to you by your health care provider. Make sure you discuss any questions you have with your health care provider. Document Released: 08/30/2006 Document Revised: 09/16/2015 Document Reviewed: 11/07/2012 Elsevier Interactive Patient Education  2017 [employer]  

## 2016-08-07 ENCOUNTER — Telehealth (HOSPITAL_COMMUNITY): Payer: Self-pay | Admitting: *Deleted

## 2016-08-07 NOTE — Telephone Encounter (Signed)
Preadmission screen  

## 2016-08-08 ENCOUNTER — Ambulatory Visit (HOSPITAL_COMMUNITY)
Admission: RE | Admit: 2016-08-08 | Discharge: 2016-08-08 | Disposition: A | Discharge status: Home / Self Care | Payer: Medicaid - Out of State | Source: Ambulatory Visit | Attending: Obstetrics & Gynecology | Admitting: Obstetrics & Gynecology

## 2016-08-08 ENCOUNTER — Other Ambulatory Visit: Payer: Self-pay | Admitting: Obstetrics & Gynecology

## 2016-08-08 DIAGNOSIS — Z3A40 40 weeks gestation of pregnancy: Secondary | ICD-10-CM | POA: Diagnosis not present

## 2016-08-08 DIAGNOSIS — Z362 Encounter for other antenatal screening follow-up: Secondary | ICD-10-CM | POA: Diagnosis present

## 2016-08-08 DIAGNOSIS — O480 Post-term pregnancy: Secondary | ICD-10-CM | POA: Insufficient documentation

## 2016-08-08 DIAGNOSIS — O3663X0 Maternal care for excessive fetal growth, third trimester, not applicable or unspecified: Secondary | ICD-10-CM

## 2016-08-09 ENCOUNTER — Other Ambulatory Visit: Payer: Self-pay | Admitting: Advanced Practice Midwife

## 2016-08-10 ENCOUNTER — Ambulatory Visit (INDEPENDENT_AMBULATORY_CARE_PROVIDER_SITE_OTHER): Payer: Self-pay | Admitting: Obstetrics & Gynecology

## 2016-08-10 ENCOUNTER — Other Ambulatory Visit (HOSPITAL_COMMUNITY)
Admission: RE | Admit: 2016-08-10 | Discharge: 2016-08-10 | Disposition: A | Discharge status: Home / Self Care | Payer: Medicaid - Out of State | Source: Ambulatory Visit | Attending: Obstetrics & Gynecology | Admitting: Obstetrics & Gynecology

## 2016-08-10 VITALS — BP 93/67 | HR 115 | Wt 304.3 lb

## 2016-08-10 DIAGNOSIS — Z8619 Personal history of other infectious and parasitic diseases: Secondary | ICD-10-CM | POA: Insufficient documentation

## 2016-08-10 DIAGNOSIS — O98813 Other maternal infectious and parasitic diseases complicating pregnancy, third trimester: Secondary | ICD-10-CM

## 2016-08-10 DIAGNOSIS — O480 Post-term pregnancy: Secondary | ICD-10-CM

## 2016-08-10 DIAGNOSIS — Z3403 Encounter for supervision of normal first pregnancy, third trimester: Secondary | ICD-10-CM

## 2016-08-10 DIAGNOSIS — Z3A40 40 weeks gestation of pregnancy: Secondary | ICD-10-CM | POA: Diagnosis not present

## 2016-08-10 DIAGNOSIS — A749 Chlamydial infection, unspecified: Secondary | ICD-10-CM | POA: Diagnosis present

## 2016-08-10 DIAGNOSIS — Z3483 Encounter for supervision of other normal pregnancy, third trimester: Secondary | ICD-10-CM

## 2016-08-10 NOTE — Patient Instructions (Signed)
Return to clinic for any scheduled appointments or obstetric concerns, or go to MAU for evaluation   Labor Induction Labor induction is when steps are taken to cause a pregnant woman to begin the labor process. Most women go into labor on their own between 37 weeks and 42 weeks of the pregnancy. When this does not happen or when there is a medical need, methods may be used to induce labor. Labor induction causes a pregnant woman's uterus to contract. It also causes the cervix to soften (ripen), open (dilate), and thin out (efface). Usually, labor is not induced before 39 weeks of the pregnancy unless there is a problem with the baby or mother. Before inducing labor, your health care provider will consider a number of factors, including the following:  The medical condition of you and the baby.  How many weeks along you are.  The status of the baby's lung maturity.  The condition of the cervix.  The position of the baby.  What are the reasons for labor induction? Labor may be induced for the following reasons:  The health of the baby or mother is at risk.  The pregnancy is overdue by 1 week or more.  The water breaks but labor does not start on its own.  The mother has a health condition or serious illness, such as high blood pressure, infection, placental abruption, or diabetes.  The amniotic fluid amounts are low around the baby.  The baby is distressed.  Convenience or wanting the baby to be born on a certain date is not a reason for inducing labor. What methods are used for labor induction? Several methods of labor induction may be used, such as:  Prostaglandin medicine. This medicine causes the cervix to dilate and ripen. The medicine will also start contractions. It can be taken by mouth or by inserting a suppository into the vagina.  Inserting a thin tube (catheter) with a balloon on the end into the vagina to dilate the cervix. Once inserted, the balloon is expanded  with water, which causes the cervix to open.  Stripping the membranes. Your health care provider separates amniotic sac tissue from the cervix, causing the cervix to be stretched and causing the release of a hormone called progesterone. This may cause the uterus to contract. It is often done during an office visit. You will be sent home to wait for the contractions to begin. You will then come in for an induction.  Breaking the water. Your health care provider makes a hole in the amniotic sac using a small instrument. Once the amniotic sac breaks, contractions should begin. This may still take hours to see an effect.  Medicine to trigger or strengthen contractions. This medicine is given through an IV access tube inserted into a vein in your arm.  All of the methods of induction, besides stripping the membranes, will be done in the hospital. Induction is done in the hospital so that you and the baby can be carefully monitored. How long does it take for labor to be induced? Some inductions can take up to 2-3 days. Depending on the cervix, it usually takes less time. It takes longer when you are induced early in the pregnancy or if this is your first pregnancy. If a mother is still pregnant and the induction has been going on for 2-3 days, either the mother will be sent home or a cesarean delivery will be needed. What are the risks associated with labor induction? Some of the risks   of induction include:  Changes in fetal heart rate, such as too high, too low, or erratic.  Fetal distress.  Chance of infection for the mother and baby.  Increased chance of having a cesarean delivery.  Breaking off (abruption) of the placenta from the uterus (rare).  Uterine rupture (very rare).  When induction is needed for medical reasons, the benefits of induction may outweigh the risks. What are some reasons for not inducing labor? Labor induction should not be done if:  It is shown that your baby does  not tolerate labor.  You have had previous surgeries on your uterus, such as a myomectomy or the removal of fibroids.  Your placenta lies very low in the uterus and blocks the opening of the cervix (placenta previa).  Your baby is not in a head-down position.  The umbilical cord drops down into the birth canal in front of the baby. This could cut off the baby's blood and oxygen supply.  You have had a previous cesarean delivery.  There are unusual circumstances, such as the baby being extremely premature.  This information is not intended to replace advice given to you by your health care provider. Make sure you discuss any questions you have with your health care provider. Document Released: 08/30/2006 Document Revised: 09/16/2015 Document Reviewed: 11/07/2012 Elsevier Interactive Patient Education  2017 [employer]  

## 2016-08-10 NOTE — Progress Notes (Signed)
   PRENATAL VISIT NOTE  Subjective:  Monique Marshall is a 26 y.o. [phone] at [redacted]w[redacted]d being seen today for ongoing prenatal care.  She is currently monitored for the following issues for this low-risk pregnancy and has Chlamydia infection complicating pregnancy in third trimester; Supervision of low-risk pregnancy, third trimester; and Limited prenatal care in third trimester on her problem list.  Patient reports occasional contractions.  Contractions: Irregular. Vag. Bleeding: Bloody Show.  Movement: Present. Denies leaking of fluid.   The following portions of the patient's history were reviewed and updated as appropriate: allergies, current medications, past family history, past medical history, past social history, past surgical history and problem list. Problem list updated.  Objective:   Vitals:   08/10/16 0835  BP: 93/67  Pulse: (!) 115  Weight: (!) 304 lb 4.8 oz (138 kg)    Fetal Status: Fetal Heart Rate (bpm): 138 Fundal Height: 43 cm Movement: Present  Presentation: Vertex  General:  Alert, oriented and cooperative. Patient is in no acute distress.  Skin: Skin is warm and dry. No rash noted.   Cardiovascular: Normal heart rate noted  Respiratory: Normal respiratory effort, no problems with respiration noted  Abdomen: Soft, gravid, appropriate for gestational age. Pain/Pressure: Present     Pelvic:  Cervical exam performed Dilation: 4.5 Effacement (%): 70 Station: -2  Extremities: Normal range of motion.  Edema: None  Mental Status: Normal mood and affect. Normal behavior. Normal judgment and thought content.   Assessment and Plan:  Pregnancy: [id number] at [redacted]w[redacted]d 1. Chlamydia infection complicating pregnancy in third trimester Already treated in March 2018. TOC done today.  - Cervicovaginal ancillary only  2. Post term pregnancy over 40 weeks BPP 8/8 on 08/08/16, IOL scheduled at 41 weeks on 08/12/16.  3. Supervision of low-risk first pregnancy, third trimester Term labor  symptoms and general obstetric precautions including but not limited to vaginal bleeding, contractions, leaking of fluid and fetal movement were reviewed in detail with the patient. Please refer to After Visit Summary for other counseling recommendations.  Return in about 6 weeks (around 09/21/2016) for Postpartum check.   Tereso Newcomer, MD

## 2016-08-11 ENCOUNTER — Encounter (HOSPITAL_COMMUNITY): Payer: Self-pay | Admitting: *Deleted

## 2016-08-11 ENCOUNTER — Inpatient Hospital Stay (HOSPITAL_COMMUNITY): Payer: Medicaid - Out of State | Admitting: Anesthesiology

## 2016-08-11 ENCOUNTER — Inpatient Hospital Stay (HOSPITAL_COMMUNITY)
Admission: AD | Admit: 2016-08-11 | Discharge: 2016-08-12 | DRG: 775 | Disposition: A | Discharge status: Home / Self Care | Payer: Medicaid - Out of State | Source: Ambulatory Visit | Attending: Family Medicine | Admitting: Family Medicine

## 2016-08-11 DIAGNOSIS — O480 Post-term pregnancy: Secondary | ICD-10-CM | POA: Diagnosis present

## 2016-08-11 DIAGNOSIS — O99334 Smoking (tobacco) complicating childbirth: Secondary | ICD-10-CM | POA: Diagnosis present

## 2016-08-11 DIAGNOSIS — Z3493 Encounter for supervision of normal pregnancy, unspecified, third trimester: Secondary | ICD-10-CM | POA: Diagnosis present

## 2016-08-11 DIAGNOSIS — F17210 Nicotine dependence, cigarettes, uncomplicated: Secondary | ICD-10-CM | POA: Diagnosis present

## 2016-08-11 DIAGNOSIS — Z3A40 40 weeks gestation of pregnancy: Secondary | ICD-10-CM

## 2016-08-11 DIAGNOSIS — Z3A41 41 weeks gestation of pregnancy: Secondary | ICD-10-CM

## 2016-08-11 LAB — CBC
HEMATOCRIT: 34.2 % — AB (ref 36.0–46.0)
Hemoglobin: 11.1 g/dL — ABNORMAL LOW (ref 12.0–15.0)
MCH: 26.1 pg (ref 26.0–34.0)
MCHC: 32.5 g/dL (ref 30.0–36.0)
MCV: 80.5 fL (ref 78.0–100.0)
Platelets: 290 10*3/uL (ref 150–400)
RBC: 4.25 MIL/uL (ref 3.87–5.11)
RDW: 15.5 % (ref 11.5–15.5)
WBC: 10.5 10*3/uL (ref 4.0–10.5)

## 2016-08-11 LAB — CERVICOVAGINAL ANCILLARY ONLY
CHLAMYDIA, DNA PROBE: NEGATIVE
Neisseria Gonorrhea: NEGATIVE

## 2016-08-11 LAB — ABO/RH: ABO/RH(D): A POS

## 2016-08-11 LAB — TYPE AND SCREEN
ABO/RH(D): A POS
Antibody Screen: NEGATIVE

## 2016-08-11 MED ORDER — OXYCODONE-ACETAMINOPHEN 5-325 MG PO TABS: 2.0000 | ORAL_TABLET | ORAL | Status: DC | PRN

## 2016-08-11 MED ORDER — ONDANSETRON HCL 4 MG/2ML IJ SOLN: 4.0000 mg | INTRAMUSCULAR | Status: DC | PRN

## 2016-08-11 MED ORDER — FENTANYL CITRATE (PF) 100 MCG/2ML IJ SOLN: 100.0000 ug | INTRAMUSCULAR | Status: DC | PRN

## 2016-08-11 MED ORDER — SODIUM CHLORIDE 0.9% FLUSH: 3.0000 mL | Freq: Two times a day (BID) | INTRAVENOUS | Status: DC

## 2016-08-11 MED ORDER — OXYTOCIN 40 UNITS IN LACTATED RINGERS INFUSION - SIMPLE MED: 1.0000 m[IU]/min | INTRAVENOUS | Status: DC

## 2016-08-11 MED ORDER — TERBUTALINE SULFATE 1 MG/ML IJ SOLN
0.2500 mg | Freq: Once | INTRAMUSCULAR | Status: DC | PRN
  Filled 2016-08-11: qty 1

## 2016-08-11 MED ORDER — BENZOCAINE-MENTHOL 20-0.5 % EX AERO: 1.0000 "application " | INHALATION_SPRAY | CUTANEOUS | Status: DC | PRN

## 2016-08-11 MED ORDER — LACTATED RINGERS IV SOLN
INTRAVENOUS | Status: DC
  Administered 2016-08-11 (×2): via INTRAVENOUS
  Administered 2016-08-11: 500 mL via INTRAVENOUS

## 2016-08-11 MED ORDER — EPHEDRINE 5 MG/ML INJ
10.0000 mg | INTRAVENOUS | Status: DC | PRN
  Filled 2016-08-11: qty 2

## 2016-08-11 MED ORDER — OXYCODONE-ACETAMINOPHEN 5-325 MG PO TABS: 1.0000 | ORAL_TABLET | ORAL | Status: DC | PRN

## 2016-08-11 MED ORDER — LACTATED RINGERS IV SOLN: 500.0000 mL | INTRAVENOUS | Status: DC | PRN

## 2016-08-11 MED ORDER — DIPHENHYDRAMINE HCL 25 MG PO CAPS: 25.0000 mg | ORAL_CAPSULE | Freq: Four times a day (QID) | ORAL | Status: DC | PRN

## 2016-08-11 MED ORDER — WITCH HAZEL-GLYCERIN EX PADS: 1.0000 "application " | MEDICATED_PAD | CUTANEOUS | Status: DC | PRN

## 2016-08-11 MED ORDER — LACTATED RINGERS IV SOLN: 500.0000 mL | Freq: Once | INTRAVENOUS | Status: AC

## 2016-08-11 MED ORDER — PHENYLEPHRINE 40 MCG/ML (10ML) SYRINGE FOR IV PUSH (FOR BLOOD PRESSURE SUPPORT)
80.0000 ug | PREFILLED_SYRINGE | INTRAVENOUS | Status: DC | PRN
  Filled 2016-08-11: qty 5

## 2016-08-11 MED ORDER — DIPHENHYDRAMINE HCL 50 MG/ML IJ SOLN: 12.5000 mg | INTRAMUSCULAR | Status: DC | PRN

## 2016-08-11 MED ORDER — ACETAMINOPHEN 325 MG PO TABS: 650.0000 mg | ORAL_TABLET | ORAL | Status: DC | PRN

## 2016-08-11 MED ORDER — SODIUM CHLORIDE 0.9 % IV SOLN: 250.0000 mL | INTRAVENOUS | Status: DC | PRN

## 2016-08-11 MED ORDER — TETANUS-DIPHTH-ACELL PERTUSSIS 5-2.5-18.5 LF-MCG/0.5 IM SUSP: 0.5000 mL | Freq: Once | INTRAMUSCULAR | Status: DC

## 2016-08-11 MED ORDER — SIMETHICONE 80 MG PO CHEW
80.0000 mg | CHEWABLE_TABLET | ORAL | Status: DC | PRN
Start: 2016-08-11 — End: 2016-08-12

## 2016-08-11 MED ORDER — SODIUM CHLORIDE 0.9% FLUSH: 3.0000 mL | INTRAVENOUS | Status: DC | PRN

## 2016-08-11 MED ORDER — FENTANYL 2.5 MCG/ML BUPIVACAINE 1/10 % EPIDURAL INFUSION (WH - ANES)
14.0000 mL/h | INTRAMUSCULAR | Status: DC | PRN
Start: 2016-08-11 — End: 2016-08-11
  Administered 2016-08-11: 14 mL/h via EPIDURAL
  Filled 2016-08-11: qty 100

## 2016-08-11 MED ORDER — ONDANSETRON HCL 4 MG/2ML IJ SOLN: 4.0000 mg | Freq: Four times a day (QID) | INTRAMUSCULAR | Status: DC | PRN

## 2016-08-11 MED ORDER — PHENYLEPHRINE 40 MCG/ML (10ML) SYRINGE FOR IV PUSH (FOR BLOOD PRESSURE SUPPORT)
80.0000 ug | PREFILLED_SYRINGE | INTRAVENOUS | Status: DC | PRN
  Filled 2016-08-11: qty 5
  Filled 2016-08-11: qty 10

## 2016-08-11 MED ORDER — LIDOCAINE HCL (PF) 1 % IJ SOLN
30.0000 mL | INTRAMUSCULAR | Status: DC | PRN
  Filled 2016-08-11: qty 30

## 2016-08-11 MED ORDER — OXYTOCIN 40 UNITS IN LACTATED RINGERS INFUSION - SIMPLE MED
2.5000 [IU]/h | INTRAVENOUS | Status: DC
  Administered 2016-08-11: 2.5 [IU]/h via INTRAVENOUS
  Filled 2016-08-11: qty 1000

## 2016-08-11 MED ORDER — ONDANSETRON HCL 4 MG PO TABS: 4.0000 mg | ORAL_TABLET | ORAL | Status: DC | PRN

## 2016-08-11 MED ORDER — DIBUCAINE 1 % RE OINT: 1.0000 "application " | TOPICAL_OINTMENT | RECTAL | Status: DC | PRN

## 2016-08-11 MED ORDER — LIDOCAINE HCL (PF) 1 % IJ SOLN
INTRAMUSCULAR | Status: DC | PRN
  Administered 2016-08-11: 6 mL via EPIDURAL
  Administered 2016-08-11: 4 mL

## 2016-08-11 MED ORDER — SOD CITRATE-CITRIC ACID 500-334 MG/5ML PO SOLN: 30.0000 mL | ORAL | Status: DC | PRN

## 2016-08-11 MED ORDER — ZOLPIDEM TARTRATE 5 MG PO TABS: 5.0000 mg | ORAL_TABLET | Freq: Every evening | ORAL | Status: DC | PRN

## 2016-08-11 MED ORDER — COCONUT OIL OIL: 1.0000 "application " | TOPICAL_OIL | Status: DC | PRN

## 2016-08-11 MED ORDER — OXYTOCIN BOLUS FROM INFUSION
500.0000 mL | Freq: Once | INTRAVENOUS | Status: AC
  Administered 2016-08-11: 500 mL via INTRAVENOUS

## 2016-08-11 MED ORDER — FENTANYL CITRATE (PF) 100 MCG/2ML IJ SOLN: 50.0000 ug | Freq: Once | INTRAMUSCULAR | Status: DC

## 2016-08-11 MED ORDER — OXYTOCIN 40 UNITS IN LACTATED RINGERS INFUSION - SIMPLE MED: 2.5000 [IU]/h | INTRAVENOUS | Status: DC | PRN

## 2016-08-11 MED ORDER — SENNOSIDES-DOCUSATE SODIUM 8.6-50 MG PO TABS
2.0000 | ORAL_TABLET | ORAL | Status: DC
  Administered 2016-08-12: 2 via ORAL
  Filled 2016-08-11: qty 2

## 2016-08-11 MED ORDER — IBUPROFEN 600 MG PO TABS
600.0000 mg | ORAL_TABLET | Freq: Four times a day (QID) | ORAL | Status: DC
  Administered 2016-08-11 – 2016-08-12 (×4): 600 mg via ORAL
  Filled 2016-08-11 (×4): qty 1

## 2016-08-11 MED ORDER — PRENATAL MULTIVITAMIN CH
1.0000 | ORAL_TABLET | Freq: Every day | ORAL | Status: DC
  Administered 2016-08-12: 1 via ORAL
  Filled 2016-08-11: qty 1

## 2016-08-11 NOTE — Anesthesia Procedure Notes (Signed)
Epidural Patient location during procedure: OB  Staffing Anesthesiologist: Shadrick Senne  Preanesthetic Checklist Completed: patient identified, site marked, surgical consent, pre-op evaluation, timeout performed, IV checked, risks and benefits discussed and monitors and equipment checked  Epidural Patient position: sitting Prep: site prepped and draped and DuraPrep Patient monitoring: continuous pulse ox and blood pressure Approach: midline Location: L3-L4 Injection technique: LOR air  Needle:  Needle type: Tuohy  Needle gauge: 17 G Needle length: 9 cm and 9 Needle insertion depth: 7 cm Catheter type: closed end flexible Catheter size: 19 Gauge Catheter at skin depth: 14 cm Test dose: negative  Assessment Events: blood not aspirated, injection not painful, no injection resistance, negative IV test and no paresthesia  Additional Notes Dosing of Epidural:  1st dose, through catheter .............................................  Xylocaine 40 mg  2nd dose, through catheter, after waiting 3 minutes.........Xylocaine 60 mg    As each dose occurred, patient was free of IV sx; and patient exhibited no evidence of SA injection.  Patient is more comfortable after epidural dosed. Please see RN's note for documentation of vital signs,and FHR which are stable.  Patient reminded not to try to ambulate with numb legs, and that an RN must be present when she attempts to get up.        

## 2016-08-11 NOTE — MAU Note (Addendum)
PT  SAYS HURT BAD SINCE  6PM.   PNC- WITH  CLINIC  -  VE YESTERDAY - 4-5  CM.      DENIES HSV AND  MRSA.  GBS- NEG          IS [facility] FOR INDUCTION  TOMORROW

## 2016-08-11 NOTE — Anesthesia Postprocedure Evaluation (Signed)
Anesthesia Post Note  Patient: Monique Marshall  Procedure(s) Performed: * No procedures listed *  Patient location during evaluation: Mother Baby Anesthesia Type: Epidural Level of consciousness: awake and alert and oriented Pain management: satisfactory to patient Vital Signs Assessment: post-procedure vital signs reviewed and stable Respiratory status: spontaneous breathing and nonlabored ventilation Cardiovascular status: stable Postop Assessment: no headache, no backache, no signs of nausea or vomiting, adequate PO intake and patient able to bend at knees (patient up walking) Anesthetic complications: no        Last Vitals:  Vitals:   08/11/16 1335 08/11/16 1435  BP: (!) 124/47 (!) 117/51  Pulse: (!) 59 75  Resp: 20 18  Temp: 36.9 C 36.9 C    Last Pain:  Vitals:   08/11/16 1435  TempSrc: Oral  PainSc: 0-No pain   Pain Goal:                 Monique Marshall

## 2016-08-11 NOTE — H&P (Signed)
Danaysha Kirn is a 26 y.o. [phone] female presenting for contractions that started at 0100. Denies ROM or vaginal bleeding.   OB History    Gravida Para Term Preterm AB Living   SAB TAB Ectopic Multiple Live Births   1       2     Past Medical History:  Diagnosis Date  . Medical history non-contributory    Past Surgical History:  Procedure Laterality Date  . NO PAST SURGERIES     Family History: family history is not on file. Social History:  reports that she has been smoking Cigarettes.  She has never used smokeless tobacco. She reports that she does not drink alcohol or use drugs.     Maternal Diabetes: No Genetic Screening: Normal Maternal Ultrasounds/Referrals: Normal Fetal Ultrasounds or other Referrals:  None Maternal Substance Abuse:  No Significant Maternal Medications:  None Significant Maternal Lab Results:  None Other Comments:  None  Review of Systems  Constitutional: Negative.   HENT: Negative.   Eyes: Negative.   Respiratory: Negative.   Cardiovascular: Negative.   Gastrointestinal: Positive for abdominal pain.  Genitourinary: Negative.   Musculoskeletal: Negative.   Skin: Negative.   Neurological: Negative.   Endo/Heme/Allergies: Negative.   Psychiatric/Behavioral: Negative.    Maternal Medical History:  Reason for admission: Contractions.   Contractions: Onset was 3-5 hours ago.   Frequency: regular.   Duration is approximately 80 seconds.   Perceived severity is mild.    Fetal activity: Perceived fetal activity is normal.   Last perceived fetal movement was within the past hour.    Prenatal complications: no prenatal complications Prenatal Complications - Diabetes: none.    Dilation: 6 Effacement (%): 80 Station: -2 Exam by:: Margret Chance, RN  Blood pressure 130/75, pulse 97, temperature 99.4 F (37.4 C), temperature source Oral, resp. rate 16, height  (1.753 m), weight (!) 304 lb (137.9 kg), last menstrual  period 10/29/2015. Maternal Exam:  Uterine Assessment: Contraction strength is mild.  Contraction duration is 80 seconds. Contraction frequency is regular.   Abdomen: Patient reports no abdominal tenderness. Estimated fetal weight is 8lbs 9oz on 4/17.   Fetal presentation: vertex  Introitus: Normal vulva. Normal vagina.  Ferning test: not done.  Nitrazine test: not done. Amniotic fluid character: not assessed.  Pelvis: adequate for delivery.   Cervix: Cervix evaluated by digital exam.     Fetal Exam Fetal Monitor Review: Mode: ultrasound.   Baseline rate: 145.  Variability: moderate (6-25 bpm).   Pattern: accelerations present and no decelerations.    Fetal State Assessment: Category I - tracings are normal.     Physical Exam  Constitutional: She is oriented to person, place, and time. She appears well-developed and well-nourished.  HENT:  Head: Normocephalic.  Eyes: Pupils are equal, round, and reactive to light.  Neck: Normal range of motion.  Cardiovascular: Normal rate and regular rhythm.   Respiratory: Effort normal.  GI: Soft.  Musculoskeletal: Normal range of motion.  Neurological: She is alert and oriented to person, place, and time.  Skin: Skin is warm and dry.  Psychiatric: She has a normal mood and affect. Her behavior is normal. Judgment and thought content normal.    Prenatal labs: ABO, Rh: --/--/A POS (04/20 0455) Antibody: PENDING (04/20 0455) Rubella: 2.73 (03/15 0916) RPR: Non Reactive (03/15 0916)  HBsAg: Negative (03/15 0916)  HIV: Non Reactive (03/15 0916)  GBS:   Negative  Assessment/Plan: Spontaneous onset  of labor. Expectant management.   Cleone Slim 08/11/2016, 6:05 AM  I have seen and examined this patient and agree with the management plan.

## 2016-08-11 NOTE — Anesthesia Pain Management Evaluation Note (Signed)
  CRNA Pain Management Visit Note  Patient: Monique Marshall, 26 y.o., female  "Hello I am a member of the anesthesia team at [facility]. We have an anesthesia team available at all times to provide care throughout the hospital, including epidural management and anesthesia for C-section. I don't know your plan for the delivery whether it a natural birth, water birth, IV sedation, nitrous supplementation, doula or epidural, but we want to meet your pain goals."   1.Was your pain managed to your expectations on prior hospitalizations?   No   2.What is your expectation for pain management during this hospitalization?     Epidural  3.How can we help you reach that goal? Epidural when desired.  Record the patient's initial score and the patient's pain goal.   Pain: 8 Told RN patient wants epidural now.  Pain Goal: 4 [facility] wants you to be able to say your pain was always managed very well.  Jaryd Drew 08/11/2016

## 2016-08-11 NOTE — Anesthesia Preprocedure Evaluation (Signed)
Anesthesia Evaluation  Patient identified by MRN, date of birth, ID band Patient awake    Reviewed: Allergy & Precautions, H&P , Patient's Chart, lab work & pertinent test results  Airway Mallampati: II  TM Distance: >3 FB Neck ROM: full    Dental no notable dental hx.    Pulmonary Current Smoker,    Pulmonary exam normal breath sounds clear to auscultation       Cardiovascular Exercise Tolerance: Good  Rhythm:regular Rate:Normal     Neuro/Psych    GI/Hepatic   Endo/Other    Renal/GU      Musculoskeletal   Abdominal   Peds  Hematology   Anesthesia Other Findings   Reproductive/Obstetrics                             Anesthesia Physical Anesthesia Plan  ASA: III  Anesthesia Plan: Epidural   Post-op Pain Management:    Induction:   Airway Management Planned:   Additional Equipment:   Intra-op Plan:   Post-operative Plan:   Informed Consent: I have reviewed the patients History and Physical, chart, labs and discussed the procedure including the risks, benefits and alternatives for the proposed anesthesia with the patient or authorized representative who has indicated his/her understanding and acceptance.   Dental Advisory Given  Plan Discussed with:   Anesthesia Plan Comments: (Labs checked- platelets confirmed with RN in room. Fetal heart tracing, per RN, reported to be stable enough for sitting procedure. Discussed epidural, and patient consents to the procedure:  included risk of possible headache,backache, failed block, allergic reaction, and nerve injury. This patient was asked if she had any questions or concerns before the procedure started.)        Anesthesia Quick Evaluation

## 2016-08-11 NOTE — Progress Notes (Signed)
Patient ID: Monique Marshall, female   DOB: March 30, 1991, 26 y.o.   MRN: [id number]  S: Patient seen & examined for progress of labor. Patient comfortable with epidural.    O:  Vitals:   08/11/16 0930 08/11/16 0935 08/11/16 0940 08/11/16 0945  BP: 130/79     Pulse: 70 70 71 73  Resp:      Temp:      TempSrc:      SpO2: 97% 100% 100% 100%  Weight:      Height:        Dilation: (P) 7 Effacement (%): (P) 90 Cervical Position: (P) Middle Station: (P) -2 Presentation: (P) Vertex Exam by:: (P) Memaw  AROM performed, clear fluid returned  FHT: 130bpm, mod var, +accels, no decels TOCO: q35min   A/P: AROM performed, clear fluid Continue expectant management Anticipate SVD

## 2016-08-11 NOTE — Lactation Note (Signed)
This note was copied from a baby's chart. Lactation Consultation Note  Patient Name: Monique Marshall [id number] Date: 08/11/2016 Reason for consult: Initial assessment Baby at 1 hr of life. Mom stated that baby is latching better than either one of her other children. She had latch trouble and low supply with her 1st. She no issues with her 2nd until she went back to work and was unable to keep up with pumping. Mom has large soft breast with everted nipples. Baby latches eagerly but needs help getting depth. Demonstrated manual expression, colostrum noted bilaterally. Discussed baby behavior, feeding frequency, baby belly size, voids, wt loss, breast changes, and nipple care. Given lactation handouts. Aware of OP services and support group.       Maternal Data Has patient been taught Hand Expression?: Yes Does the patient have breastfeeding experience prior to this delivery?: Yes  Feeding Feeding Type: Breast Fed  LATCH Score/Interventions Latch: Repeated attempts needed to sustain latch, nipple held in mouth throughout feeding, stimulation needed to elicit sucking reflex. Intervention(s): Adjust position;Assist with latch  Audible Swallowing: A few with stimulation Intervention(s): Skin to skin;Hand expression  Type of Nipple: Everted at rest and after stimulation  Comfort (Breast/Nipple): Soft / non-tender     Hold (Positioning): Assistance needed to correctly position infant at breast and maintain latch. Intervention(s): Position options;Support Pillows  LATCH Score: 7  Lactation Tools Discussed/Used     Consult Status Consult Status: Follow-up Date: 08/12/16 Follow-up type: In-patient    Rulon Eisenmenger 08/11/2016, 12:32 PM

## 2016-08-12 ENCOUNTER — Inpatient Hospital Stay (HOSPITAL_COMMUNITY): Admission: RE | Admit: 2016-08-12 | Payer: MEDICAID | Source: Ambulatory Visit | Admitting: Family Medicine

## 2016-08-12 MED ORDER — IBUPROFEN 600 MG PO TABS: 600.0000 mg | ORAL_TABLET | Freq: Four times a day (QID) | ORAL | 0 refills | Status: AC | PRN

## 2016-08-12 NOTE — Discharge Summary (Signed)
OB Discharge Summary     Patient Name: Monique Marshall DOB: 03/19/1991 MRN: [id number]  Date of admission: 08/11/2016 Delivering MD: Jen Mow [facility]   Date of discharge: 08/12/2016  Admitting diagnosis: 41 WEEKS CTX  Intrauterine pregnancy: [redacted]w[redacted]d     Secondary diagnosis:  Active Problems:   Post term pregnancy at [redacted] weeks gestation  Additional problems: active labor     Discharge diagnosis: Term Pregnancy Delivered                                                                                                Post partum procedures:none  Augmentation: AROM  Complications: None  Hospital course:  Onset of Labor With Vaginal Delivery     26 y.o. yo [phone] at [redacted]w[redacted]d was admitted in Active Labor on 08/11/2016. Patient had an uncomplicated labor course as follows:  Membrane Rupture Time/Date: 10:02 AM ,08/11/2016   Intrapartum Procedures: Episiotomy: None [1]                                         Lacerations:  None [1]  Patient had a delivery of a Viable infant. 08/11/2016  Information for the patient's newborn:  Ashaunte, Standley [[id number]]  Delivery Method: Vaginal, Spontaneous Delivery (Filed from Delivery Summary)    Pateint had an uncomplicated postpartum course.  She is ambulating, tolerating a regular diet, passing flatus, and urinating well. Patient is discharged home in stable condition on 08/12/16.   Physical exam  Vitals:   08/11/16 1435 08/11/16 1806 08/11/16 2139 08/12/16 0500  BP: (!) 117/51 (!) 102/45 (!) 108/49 115/64  Pulse: 75 72 72 62  Resp: Temp: 98.4 F (36.9 C) 98.5 F (36.9 C) 97.8 F (36.6 C) 98.3 F (36.8 C)  TempSrc: Oral Oral Oral Oral  SpO2: 99% 99%    Weight:      Height:       General: alert and cooperative Lochia: appropriate Uterine Fundus: firm Incision: N/A DVT Evaluation: No evidence of DVT seen on physical exam. Labs: Lab Results  Component Value Date   WBC 10.5 08/11/2016   HGB 11.1 (L)  08/11/2016   HCT 34.2 (L) 08/11/2016   MCV 80.5 08/11/2016   PLT 290 08/11/2016   CMP Latest Ref Rng & Units 06/22/2016  Glucose 65 - 99 mg/dL 78  BUN 6 - 20 mg/dL 3(L)  Creatinine 4.03 - 1.00 mg/dL 4.74  Sodium 259 - 563 mmol/L 139  Potassium 3.5 - 5.1 mmol/L 3.2(L)  Chloride 101 - 111 mmol/L 104    Discharge instruction: per After Visit Summary and "Baby and Me Booklet".  After visit meds:  Allergies as of 08/12/2016      Reactions   Penicillins Anaphylaxis, Hives   Has patient had a PCN reaction causing immediate rash, facial/tongue/throat swelling, SOB or lightheadedness with hypotension: Yes Has patient had a PCN reaction causing severe rash involving mucus membranes or skin necrosis: No Has patient had a PCN reaction that required hospitalization  Yes Has patient had a PCN reaction occurring within the last 10 years: Yes If all of the above answers are "NO", then may proceed with Cephalosporin use.      Medication List    TAKE these medications   ibuprofen 600 MG tablet Commonly known as:  ADVIL,MOTRIN Take 1 tablet (600 mg total) by mouth every 6 (six) hours as needed.   prenatal multivitamin Tabs tablet Take 1 tablet by mouth daily.       Diet: routine diet  Activity: Advance as tolerated. Pelvic rest for 6 weeks.   Outpatient follow up:6 weeks Follow up Appt:No future appointments. Follow up Visit:No Follow-up on file.  Postpartum contraception: IUD Mirena  Newborn Data: Live born female  Birth Weight: 8 lb 9.7 oz (3905 g) APGAR: 8, 9  Baby Feeding: Breast Disposition:home with mother   08/12/2016 Cam Hai, CNM  10:01 AM

## 2016-08-12 NOTE — Progress Notes (Signed)
CLINICAL SOCIAL WORK MATERNAL/CHILD NOTE  Patient Details  Name: Monique Marshall MRN: [id number] Date of Birth: 08/11/2015  Date:  08/12/2016  Clinical Social Worker Initiating Note:  Obi Scrima, LCSW Date/ Time Initiated:  08/12/16/1350     Child's Name:  Monique Marshall   Legal Guardian:  Other (Comment) (Piper Lodato and William Marshall)   Need for Interpreter:  None   Date of Referral:  08/12/16     Reason for Referral:  Late or No Prenatal Care    Referral Source:  Central Nursery   Address:  [address], [address], [place], [place] [address]  Phone number:  [phone]   Household Members:  Minor Children (MOB has two sons from previous relationships: Josiah (4) and Oliver (2).)   Natural Supports (not living in the home):  Extended Family (Parents are new to the area, but have supports in [place].  A woman MOB identifies are her mother (though not biological) is here visiting from [place].  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  FOB is a Chef at [employer].   Education:      Financial Resources:  Medicaid (MOB needs to transfer Medicaid benefits from [place].)   Other Resources:   (Plans to apply for WIC and transfer Food Stamps from [place].)   Cultural/Religious Considerations Which May Impact Care: None  Strengths:  Ability to meet basic needs , Pediatrician chosen , Home prepared for child  ([facility])   Risk Factors/Current Problems:  None   Cognitive State:  Able to Concentrate , Alert , Linear Thinking , Goal Oriented , Insightful    Mood/Affect:  Calm , Interested , Comfortable    CSW Assessment: CSW met with MOB in her first floor room/144 to offer support and complete assessment due to recent move from [place] and limited PNC.  Parents were pleasant and agreeable to CSW's visit, although CSW found it  difficult at times to converse because MOB's 25 year old was screaming in the room at times.  MOB introduced her family in the room as her mother and FOB and stated we could talk about anything with them present.   MOB reports that she and FOB moved here about 6 weeks ago for employment opportunities.  She states that they are happy to be here and feel they are settling in well.  She reports that they have everything they need for baby at home and are aware of SIDS precautions as reviewed by CSW.  CSW ensured that they are familiar with how to apply for resources here and gave information about DHHS services and WIC.  MOB was appreciative.   Parents report that they have been together a year and that this was a planned pregnancy.  MOB reports that she receives Child Support from her oldest's father, and that her 2 year old's biological father is not involved.  FOB and 2 year old have a noticeable bond, which CSW commented on.  MOB agreed and states they are "like this (crossing her fingers together)."  Her mother explained that she is not MOB's biological mother, but actually the mother of MOB's oldest child's father.  She reports that she is more MOB's mother than his.  She states that she encouraged MOB to file for Child Support and now that son is living   in [place] with his sister, MOB and her "mother" feel he may become involved in the child's life.  MOB states she is fine with this and has never tried to keep him away.  She appears thankful for the support his mother continues to offer to her. MOB states that she experienced brief, mild PPD after her first child.  CSW provided education and resources regarding PMADs and encouraged her to call her doctor if she has concerns about her mental health at any time.  Her mother states that she is impressed with the support services that this hospital provides and thanked CSW for the information.   CSW inquired about MOB's PNC hx.  MOB reports that she did not  find out she was pregnant until 4 months and began PNC in [place].  She states having difficulty re-applying for Medicaid at that time and having a lapse in coverage.  She reports initiating care once she came to [facility].  CSW informed her of hospital drug screen policy.  She was not concerned as she denies substance use.  Baby's UDS is negative and CDS is pending.  CSW will monitor CDS results and make CPS report if warranted.  CSW Plan/Description:  Information/Referral to Community Resources , No Further Intervention Required/No Barriers to Discharge, Patient/Family Education     Adaia Matthies Elizabeth, LCSW 08/12/2016, 3:55 PM  

## 2016-08-12 NOTE — Lactation Note (Signed)
This note was copied from a baby's chart. Lactation Consultation Note  P3, Baby latched in cradle upon entering. Discussed unwrapping for feedings to keep him more active and breastfeeding on both breasts per session. Reviewed pumping strategies for going back to work. Provided mother w/ manual pump. Mom encouraged to feed baby 8-12 times/24 hours and with feeding cues.  Reviewed engorgement care and monitoring voids/stools.   Patient Name: Monique Marshall [id number] Date: 08/12/2016 Reason for consult: Follow-up assessment   Maternal Data    Feeding Feeding Type: Breast Fed  LATCH Score/Interventions Latch: Grasps breast easily, tongue down, lips flanged, rhythmical sucking.  Audible Swallowing: A few with stimulation  Type of Nipple: Everted at rest and after stimulation  Comfort (Breast/Nipple): Soft / non-tender     Hold (Positioning): Assistance needed to correctly position infant at breast and maintain latch.  LATCH Score: 8  Lactation Tools Discussed/Used     Consult Status Consult Status: Complete    Hardie Pulley 08/12/2016, 12:17 PM

## 2016-08-12 NOTE — Discharge Instructions (Signed)

## 2016-08-13 LAB — RPR: RPR Ser Ql: NONREACTIVE

## 2016-08-14 LAB — GC/CHLAMYDIA PROBE AMP (~~LOC~~) NOT AT ARMC
Chlamydia: NEGATIVE
NEISSERIA GONORRHEA: NEGATIVE

## 2016-08-15 ENCOUNTER — Encounter: Payer: Self-pay | Admitting: General Practice

## 2016-09-20 ENCOUNTER — Ambulatory Visit (INDEPENDENT_AMBULATORY_CARE_PROVIDER_SITE_OTHER): Payer: Self-pay | Admitting: Advanced Practice Midwife

## 2016-09-20 ENCOUNTER — Encounter: Payer: Self-pay | Admitting: Advanced Practice Midwife

## 2016-09-20 DIAGNOSIS — Z30011 Encounter for initial prescription of contraceptive pills: Secondary | ICD-10-CM | POA: Insufficient documentation

## 2016-09-20 LAB — POCT PREGNANCY, URINE: PREG TEST UR: NEGATIVE

## 2016-09-20 MED ORDER — NORETHINDRONE 0.35 MG PO TABS: 1.0000 | ORAL_TABLET | Freq: Every day | ORAL | 11 refills | Status: AC

## 2016-09-20 NOTE — Patient Instructions (Signed)
Oral Contraception Use Oral contraceptive pills (OCPs) are medicines taken to prevent pregnancy. OCPs work by preventing the ovaries from releasing eggs. The hormones in OCPs also cause the cervical mucus to thicken, preventing the sperm from entering the uterus. The hormones also cause the uterine lining to become thin, not allowing a fertilized egg to attach to the inside of the uterus. OCPs are highly effective when taken exactly as prescribed. However, OCPs do not prevent sexually transmitted diseases (STDs). Safe sex practices, such as using condoms along with an OCP, can help prevent STDs. Before taking OCPs, you may have a physical exam and Pap test. Your health care provider may also order blood tests if necessary. Your health care provider will make sure you are a good candidate for oral contraception. Discuss with your health care provider the possible side effects of the OCP you may be prescribed. When starting an OCP, it can take 2 to 3 months for the body to adjust to the changes in hormone levels in your body. How to take oral contraceptive pills Your health care provider may advise you on how to start taking the first cycle of OCPs. Otherwise, you can:  Start on day 1 of your menstrual period. You will not need any backup contraceptive protection with this start time.  Start on the first Sunday after your menstrual period or the day you get your prescription. In these cases, you will need to use backup contraceptive protection for the first week.  Start the pill at any time of your cycle. If you take the pill within 5 days of the start of your period, you are protected against pregnancy right away. In this case, you will not need a backup form of birth control. If you start at any other time of your menstrual cycle, you will need to use another form of birth control for 7 days. If your OCP is the type called a minipill, it will protect you from pregnancy after taking it for 2 days (48  hours).  After you have started taking OCPs:  If you forget to take 1 pill, take it as soon as you remember. Take the next pill at the regular time.  If you miss 2 or more pills, call your health care provider because different pills have different instructions for missed doses. Use backup birth control until your next menstrual period starts.  If you use a 28-day pack that contains inactive pills and you miss 1 of the last 7 pills (pills with no hormones), it will not matter. Throw away the rest of the non-hormone pills and start a new pill pack.  No matter which day you start the OCP, you will always start a new pack on that same day of the week. Have an extra pack of OCPs and a backup contraceptive method available in case you miss some pills or lose your OCP pack. Follow these instructions at home:  Do not smoke.  Always use a condom to protect against STDs. OCPs do not protect against STDs.  Use a calendar to mark your menstrual period days.  Read the information and directions that came with your OCP. Talk to your health care provider if you have questions. Contact a health care provider if:  You develop nausea and vomiting.  You have abnormal vaginal discharge or bleeding.  You develop a rash.  You miss your menstrual period.  You are losing your hair.  You need treatment for mood swings or depression.  You   get dizzy when taking the OCP.  You develop acne from taking the OCP.  You become pregnant. Get help right away if:  You develop chest pain.  You develop shortness of breath.  You have an uncontrolled or severe headache.  You develop numbness or slurred speech.  You develop visual problems.  You develop pain, redness, and swelling in the legs. This information is not intended to replace advice given to you by your health care provider. Make sure you discuss any questions you have with your health care provider. Document Released: 03/30/2011 Document  Revised: 09/16/2015 Document Reviewed: 09/29/2012 Elsevier Interactive Patient Education  2017 Elsevier Inc.  

## 2016-09-20 NOTE — Progress Notes (Signed)
Subjective:     Monique Marshall is a 26 y.o. female who presents for a postpartum visit. She is 6 weeks postpartum following a spontaneous vaginal delivery. I have fully reviewed the prenatal and intrapartum course. The delivery was at 40 gestational weeks. Outcome: spontaneous vaginal delivery. Anesthesia: epidural. Postpartum course has been Uneventful. Baby's course has been Unevetnful. Baby is feeding by breast. Bleeding no bleeding. Bowel function is normal. Bladder function is normal. Patient is sexually active. Contraception method is condoms. Postpartum depression screening: negative.  The following portions of the patient's history were reviewed and updated as appropriate: allergies, current medications, past family history, past medical history, past social history, past surgical history and problem list.  Review of Systems Pertinent items are noted in HPI.   Objective:    BP 124/78   Pulse 90   Ht 5\' 9"  (1.753 m)   Wt 290 lb 9.6 oz (131.8 kg)   Breastfeeding? Yes   BMI 42.91 kg/m   General:  alert, cooperative and no distress   Breasts:  inspection negative, no nipple discharge or bleeding, no masses or nodularity palpable  Lungs: clear to auscultation bilaterally  Heart:  regular rate and rhythm, S1, S2 normal, no murmur, click, rub or gallop  Abdomen: soft, non-tender; bowel sounds normal; no masses,  no organomegaly   Vulva:  not evaluated  Vagina: not evaluated  Cervix:  Declines  Corpus: not examined  Adnexa:  not evaluated  Rectal Exam: Not performed.        Assessment:     Normal postpartum exam. Pap smear Not done (done in March) at today's visit.   Plan:    1. Contraception: oral progesterone-only contraceptive 2.  Discussed Types of contraception,  Needs to get Medicaid for [employer].  [facility].  Discussed permanency of BTL.  Medicaid would cover with 30 day papers as interval BTL.  3. Follow up in: 1 month for BP check or as  needed.

## 2016-10-24 ENCOUNTER — Ambulatory Visit (INDEPENDENT_AMBULATORY_CARE_PROVIDER_SITE_OTHER): Payer: Medicaid Other | Admitting: Advanced Practice Midwife

## 2016-10-24 ENCOUNTER — Encounter: Payer: Self-pay | Admitting: Advanced Practice Midwife

## 2016-10-24 VITALS — BP 114/69 | HR 89 | Wt 289.1 lb

## 2016-10-24 DIAGNOSIS — Z3202 Encounter for pregnancy test, result negative: Secondary | ICD-10-CM | POA: Diagnosis not present

## 2016-10-24 DIAGNOSIS — Z30430 Encounter for insertion of intrauterine contraceptive device: Secondary | ICD-10-CM

## 2016-10-24 LAB — POCT PREGNANCY, URINE: PREG TEST UR: NEGATIVE

## 2016-10-24 MED ORDER — LEVONORGESTREL 18.6 MCG/DAY IU IUD
INTRAUTERINE_SYSTEM | Freq: Once | INTRAUTERINE | Status: AC
  Administered 2016-10-24: 1 via INTRAUTERINE

## 2016-10-24 NOTE — Addendum Note (Signed)
Addended by: Faythe CasaBELLAMY, Jaeley Wiker M on: 10/24/2016 04:53 PM   Modules accepted: Orders

## 2016-10-24 NOTE — Addendum Note (Signed)
Addended by: Faythe CasaBELLAMY, JEANETTA M on: 10/24/2016 04:12 PM   Modules accepted: Orders

## 2016-10-24 NOTE — Progress Notes (Signed)
GYNECOLOGY OFFICE PROCEDURE NOTE  Monique Marshall is a 26 y.o. [phone] here for Liletta IUD insertion. No GYN concerns.  Last pap smear was on 07/06/2016 and was normal. Hat +CT at that time, and was treated with TOC.   IUD Insertion Procedure Note Patient identified, informed consent performed, consent signed.   Discussed risks of irregular bleeding, cramping, infection, malpositioning or misplacement of the IUD outside the uterus which may require further procedure such as laparoscopy. Time out was performed.  Urine pregnancy test negative.  Speculum placed in the vagina.  Cervix visualized.  Cleaned with Betadine x 2.  Grasped anteriorly with a double tooth tenaculum.  Uterus sounded to 10 cm.  Liletta IUD placed per manufacturer's recommendations.  First IUD did not release appropriately, and a second IUD was obtained. This one released as intended. Strings trimmed to 3 cm. Tenaculum was removed, good hemostasis noted after holding pressure with faux swabs.  Patient tolerated procedure well.   Patient was given post-procedure instructions.  She was advised to have backup contraception for one week.  Patient was also asked to check IUD strings periodically and follow up in 4 weeks for IUD check.   Thressa ShellerHeather Hogan 3:42 PM 10/24/16  [facility]

## 2016-11-30 ENCOUNTER — Ambulatory Visit: Payer: Medicaid Other | Admitting: Advanced Practice Midwife

## 2018-04-13 IMAGING — US US MFM OB COMP +14 WKS
1 series · 13 of 28 positions shown · non-contrast
Comparison: none

[Series 1: us mfm ob comp +14 wks · 13 of 68 slices shown]
[im 3/68]
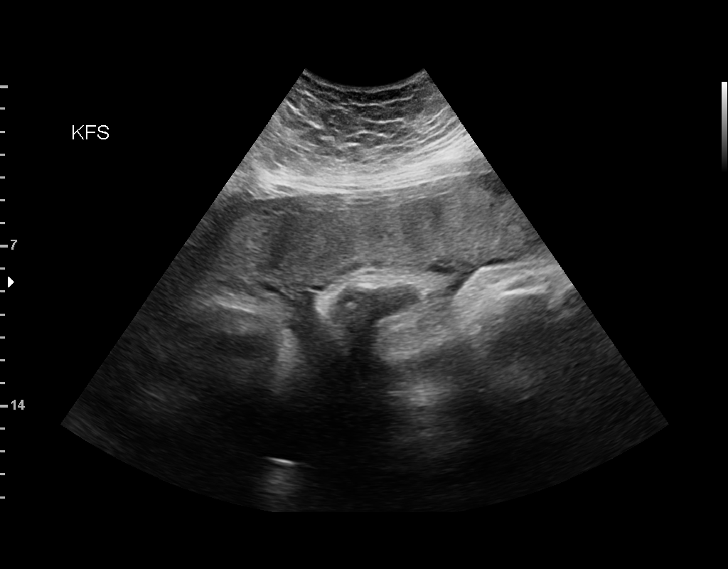
[im 8/68]
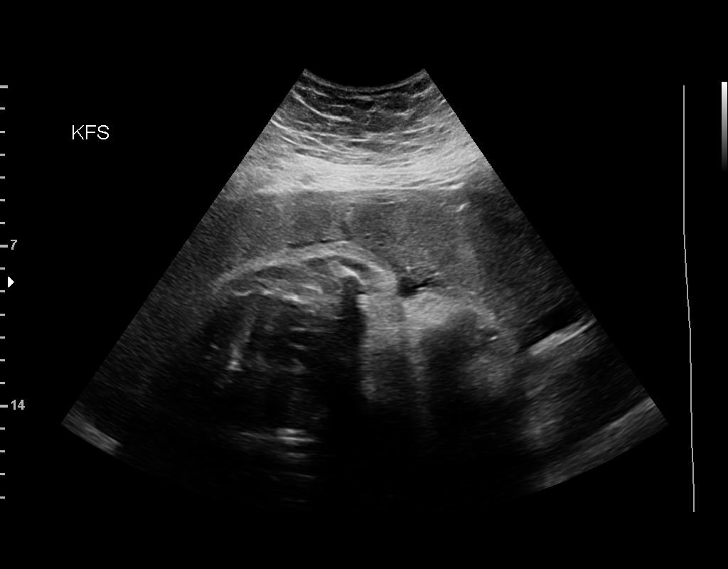
[im 13/68]
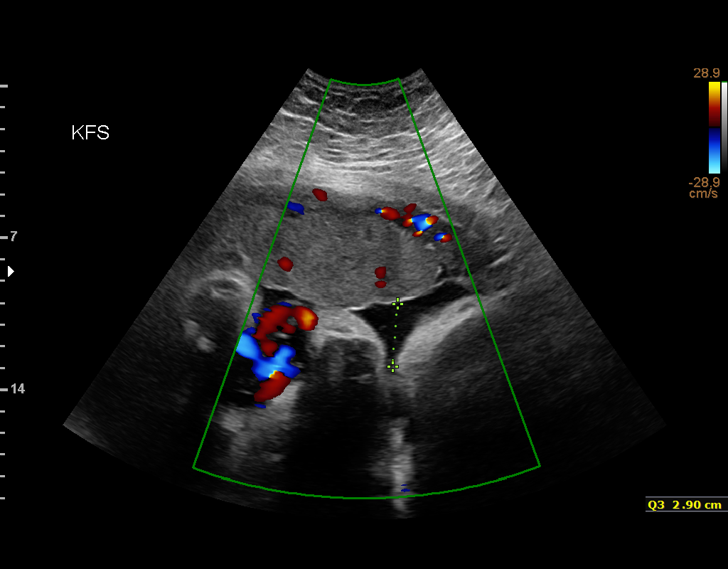
[im 18/68]
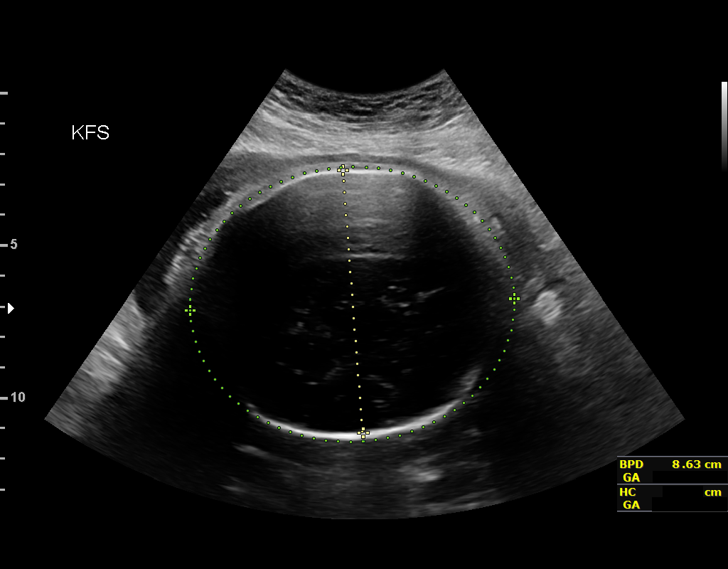
[im 23/68]
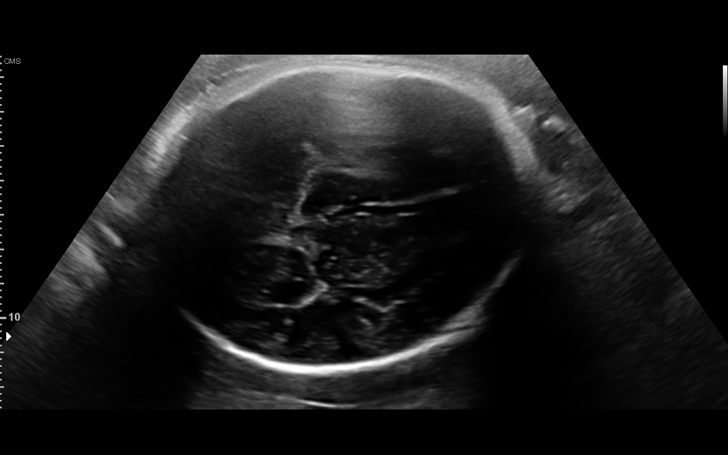
[im 28/68]
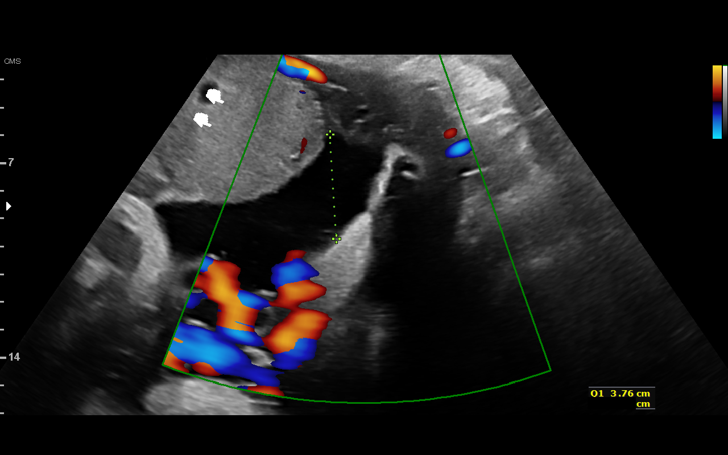
[im 35/68]
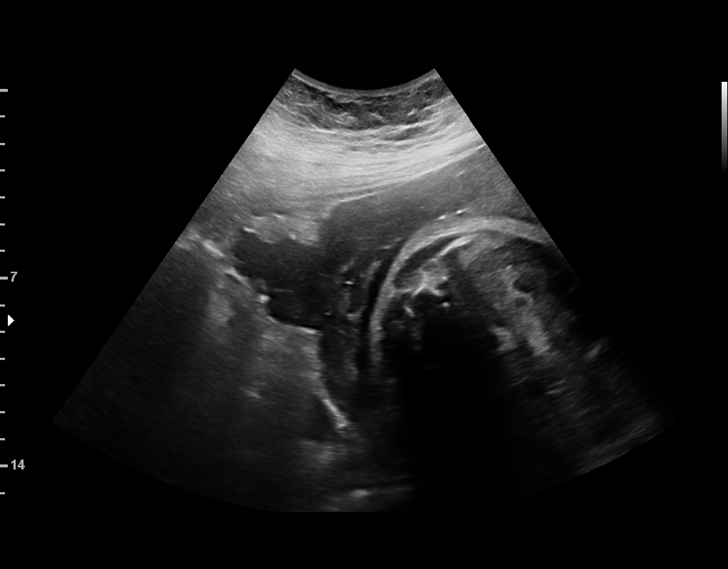
[im 40/68]
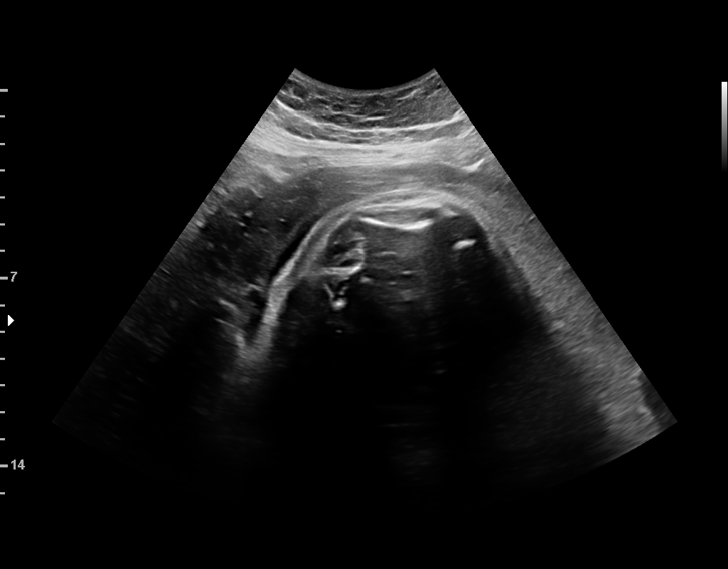
[im 45/68]
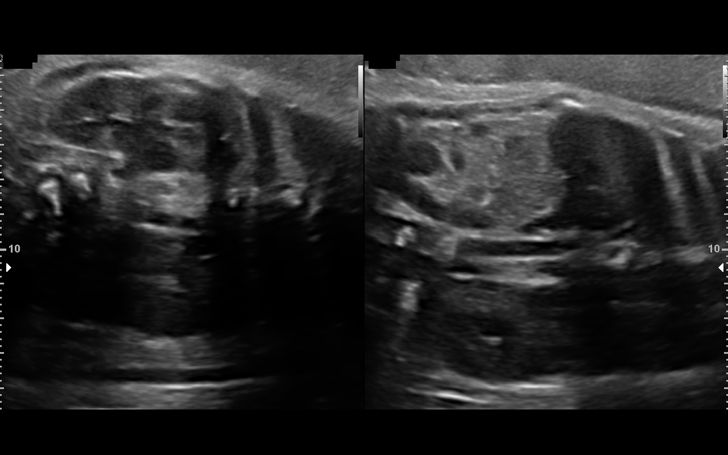
[im 50/68]
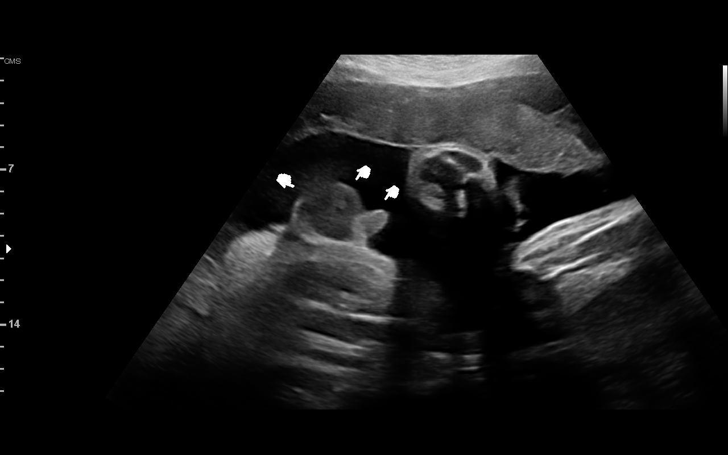
[im 55/68]
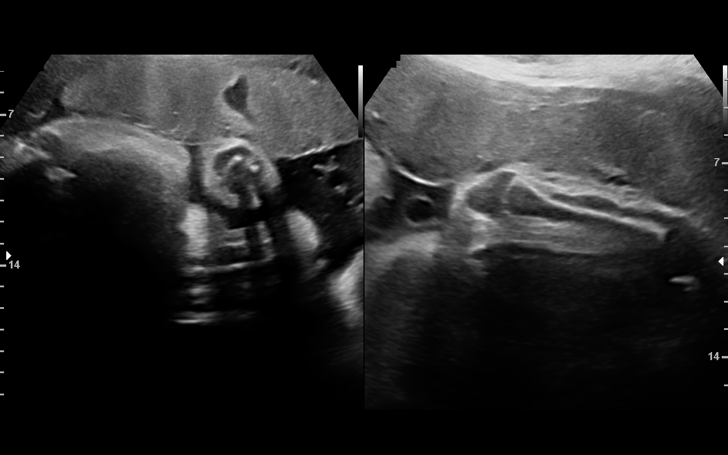
[im 60/68]
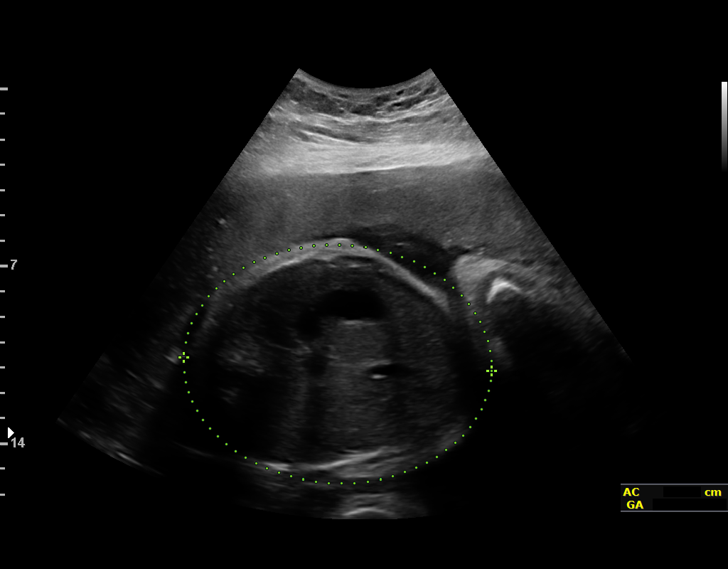
[im 65/68]
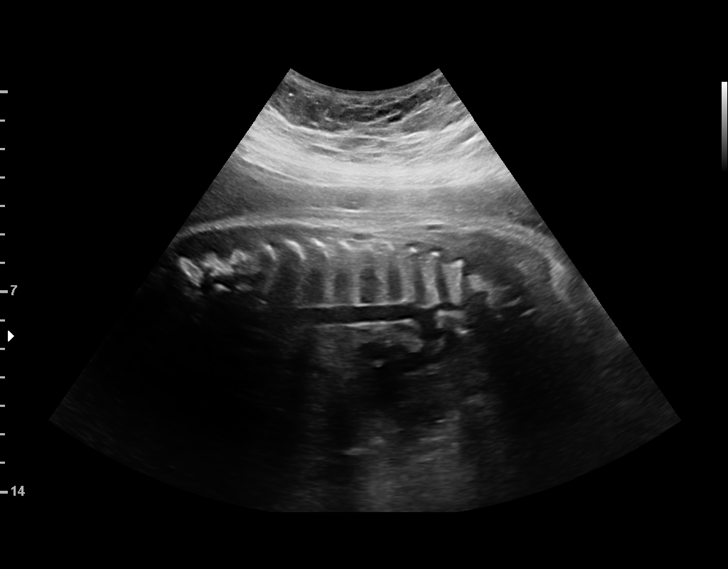

[13 of 28 positions shown; findings below may reference images not displayed]

OB/Gyn Clinic

1  LACHGUER MONAIM             [id number]      [id number]     [id number]
Indications

36 weeks gestation of pregnancy
Encounter for fetal anatomic survey
OB History

Gravidity:    4         Term:   2        Prem:   0         SAB:   1
TOP:          0       Ectopic:  0        Living: 2
Fetal Evaluation

Num Of Fetuses:     1
Fetal Heart         132
Rate(bpm):
Cardiac Activity:   Observed
Presentation:       Cephalic
Placenta:           Anterior, above cervical os
P. Cord Insertion:  Visualized, central

Amniotic Fluid
AFI FV:      Subjectively within normal limits

AFI Sum(cm)     %Tile       Largest Pocket(cm)
13.51           50

RUQ(cm)       RLQ(cm)       LUQ(cm)        LLQ(cm)
3.85
Biometry

BPD:      86.1  mm     G. Age:  34w 5d         11  %    CI:         74.84  %    70 - 86
FL/HC:       22.0  %    20.8 -
HC:      315.8  mm     G. Age:  35w 3d          5  %    HC/AC:       0.92       0.92 -
AC:        345  mm     G. Age:  38w 3d         93  %    FL/BPD:      80.8  %    71 - 87
FL:       69.6  mm     G. Age:  35w 5d         21  %    FL/AC:       20.2  %    20 - 24
HUM:      59.5  mm     G. Age:  34w 4d         24  %

Est. FW:    2027   gm    6 lb 13 oz     71  %
Gestational Age

LMP:           37w 0d        Date:  10/29/15                 EDD:    08/04/16
U/S Today:     36w 1d                                        EDD:    08/10/16
Best:          36w 6d     Det. By:  Previous Ultrasound      EDD:    08/05/16
Anatomy

Cranium:               Appears normal         Aortic Arch:            Appears normal
Cavum:                 Appears normal         Ductal Arch:            Not well visualized
Ventricles:            Appears normal         Diaphragm:              Appears normal
Choroid Plexus:        Not well visualized    Stomach:                Appears normal, left
sided
Cerebellum:            Not well visualized    Abdomen:                Appears normal
Posterior Fossa:       Not well visualized    Abdominal Wall:         Not well visualized
Nuchal Fold:           Not applicable (>20    Cord Vessels:           Appears normal (3
wks GA)                                        vessel cord)
Face:                  Appears normal         Kidneys:                Appear normal
(orbits and profile)
Lips:                  Appears normal         Bladder:                Appears normal
Thoracic:              Appears normal         Spine:                  Limited views
appear normal
Heart:                 Appears normal         Upper Extremities:      Visualized
(4CH, axis, and
situs)
RVOT:                  Appears normal         Lower Extremities:      Visualized
LVOT:                  Appears normal

Other:  Male gender. Nasal bone visualized. Technically difficult due to
advanced gestational age.
Cervix Uterus Adnexa

Cervix
Not visualized (advanced GA >82wks)

Uterus
No abnormality visualized.

Left Ovary
Not visualized.

Right Ovary
Not visualized.

Cul De Sac:   No free fluid seen.

Adnexa:       No abnormality visualized.
Impression

SIUP at 36+6 weeks
Cephalic presentation
Normal detailed fetal anatomy; limited views of posterior
fossa, DA and CI
Normal amniotic fluid volume
Measurements consistent with prior US; EFW at the 71st
%tile
Recommendations

Follow-up as clinically indicated

## 2018-05-08 IMAGING — US US MFM FETAL BPP W/O NON-STRESS
1 series · 13 of 28 positions shown · non-contrast
Comparison: none

[Series 1: us mfm fetal bpp w/o non-stress · 57 acquisitions, 13 frames shown]
[im 3/57]
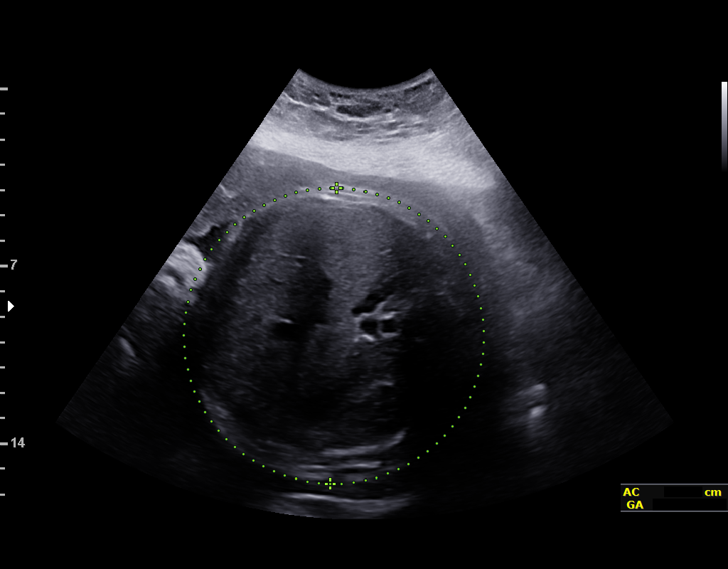
[im 7/57]
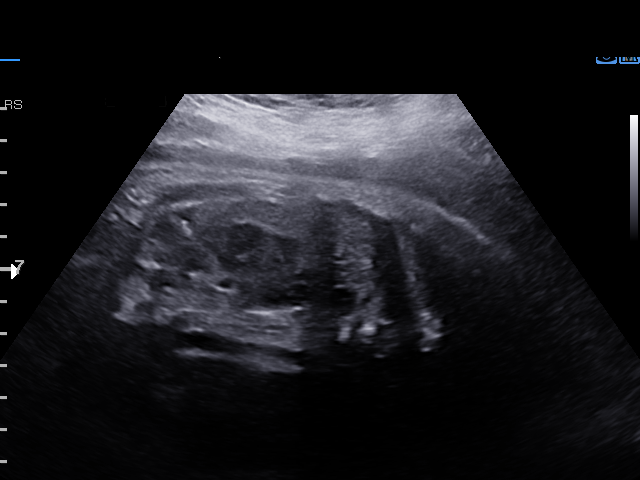
[im 11/57]
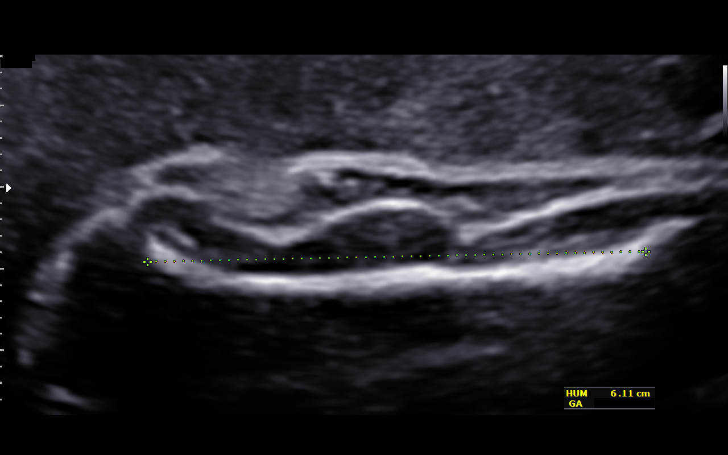
[im 15/57]
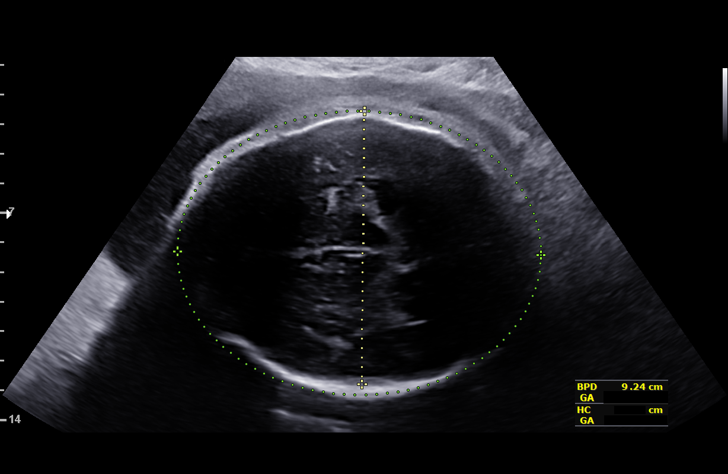
[im 19/57]
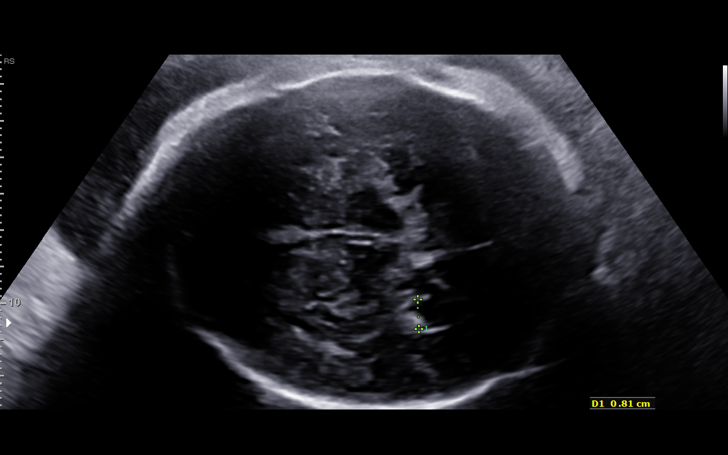
[im 23/57]
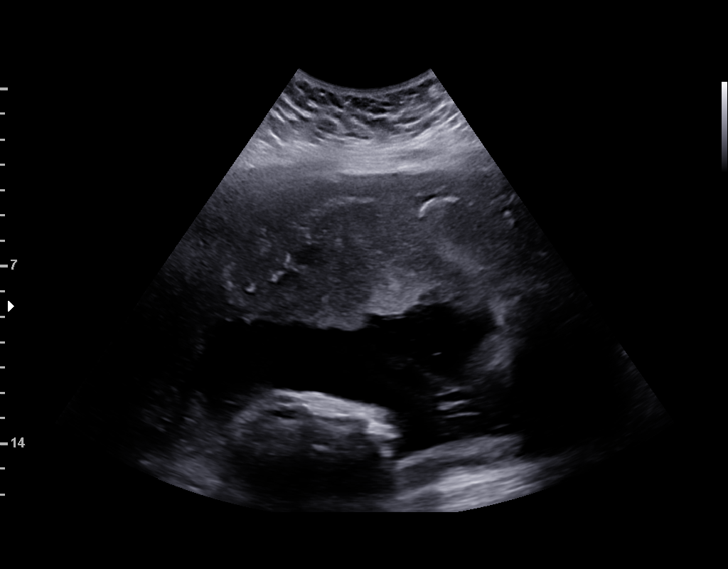
[im 30/57]
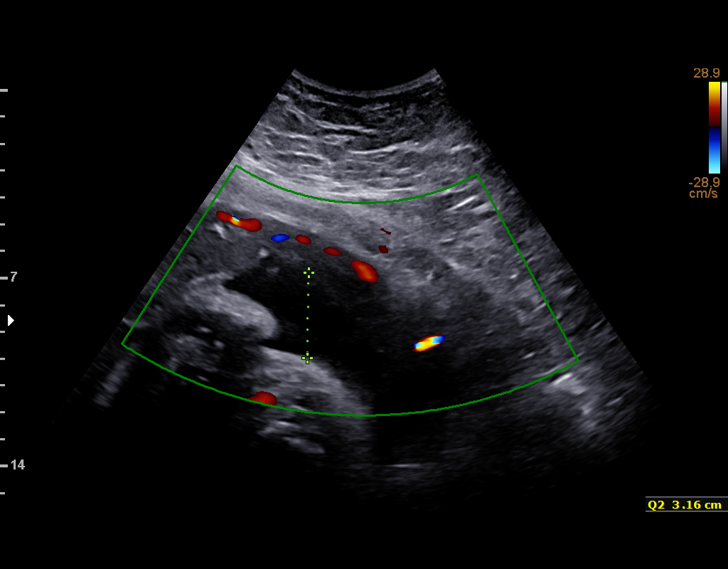
[im 34/57]
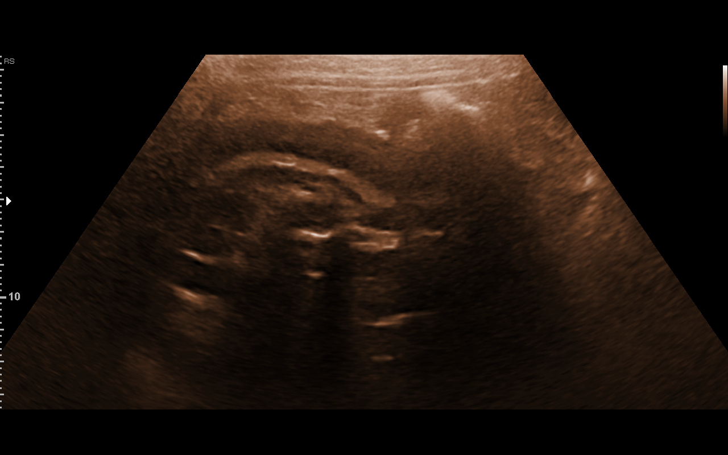
[im 38/57]
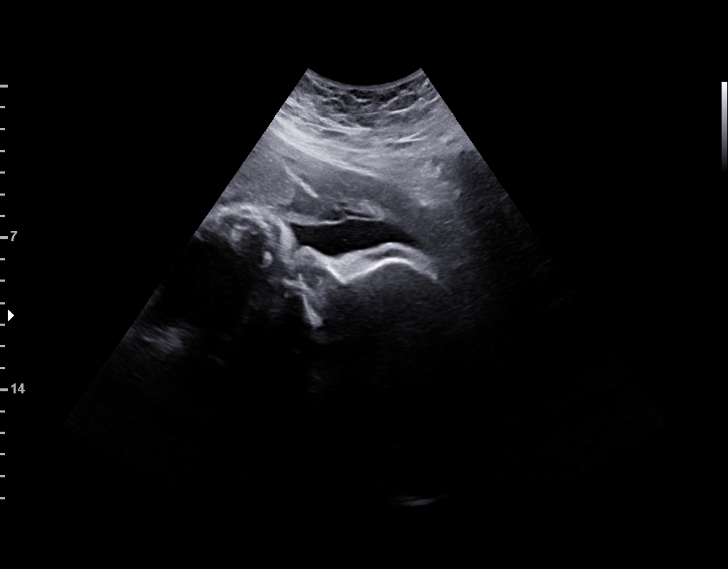
[im 42/57]
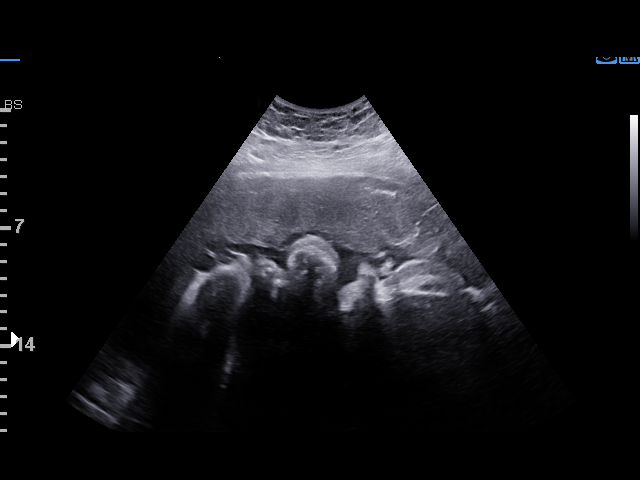
[im 46/57]
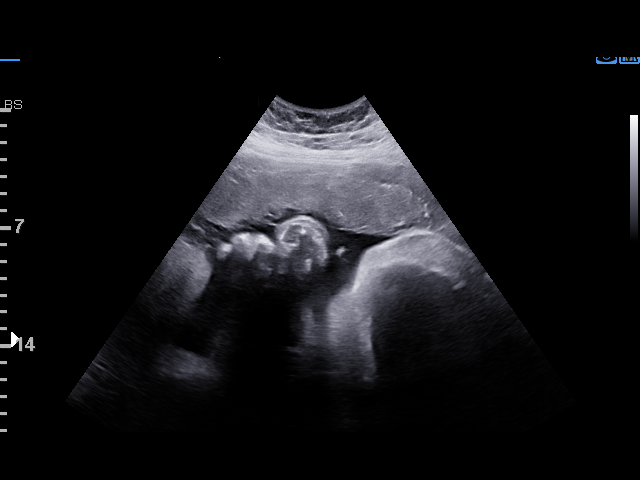
[im 50/57]
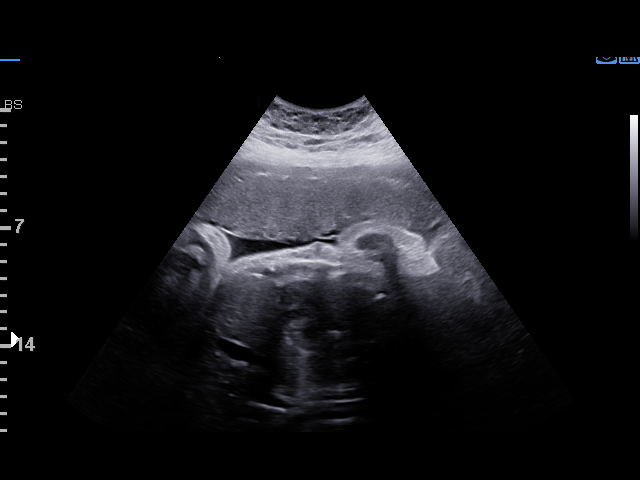
[im 54/57]
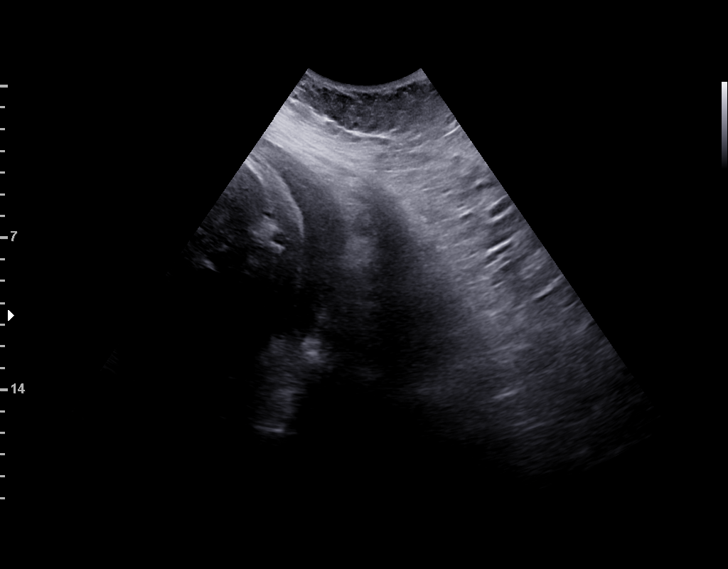

[13 of 28 positions shown; findings below may reference images not displayed]

OB/Gyn Clinic

1  LATEISHA CARVALLO           [id number]      [id number]     [id number]
2  LATEISHA CARVALLO           [id number]      [id number]     [id number]
Indications

40 weeks gestation of pregnancy
Postdate pregnancy (40-42 weeks)
Encounter for other antenatal screening
follow-up
OB History

Gravidity:    4         Term:   2        Prem:   0        SAB:   1
TOP:          0       Ectopic:  0        Living: 2
Fetal Evaluation

Num Of Fetuses:     1
Fetal Heart         144
Rate(bpm):
Cardiac Activity:   Observed
Presentation:       Cephalic
Placenta:           Anterior, above cervical os
P. Cord Insertion:  Previously Visualized

Amniotic Fluid
AFI FV:      Subjectively within normal limits

AFI Sum(cm)     %Tile       Largest Pocket(cm)
13.04           56
RUQ(cm)       RLQ(cm)       LUQ(cm)        LLQ(cm)
2.12
Biophysical Evaluation

Amniotic F.V:   Pocket => 2 cm two         F. Tone:        Observed
planes
F. Movement:    Observed                   Score:          [DATE]
F. Breathing:   Observed
Biometry

BPD:      92.8  mm     G. Age:  37w 5d         25  %    CI:        72.16   %    70 - 86
FL/HC:      21.5   %    21.6 -
HC:      347.6  mm     G. Age:  40w 2d                  HC/AC:      0.94        0.93 -
AC:      368.2  mm     G. Age:  40w 5d                  FL/BPD:     80.5   %
FL:       74.7  mm     G. Age:  38w 2d                  FL/AC:      20.3   %    20 - 24
HUM:      60.7  mm     G. Age:  35w 1d

Est. FW:    4888  gm      8 lb 9 oz     88  %
Gestational Age

LMP:           40w 4d        Date:  10/29/15                 EDD:   08/04/16
U/S Today:     39w 2d                                        EDD:   08/13/16
Best:          40w 4d     Det. By:  LMP  (10/29/15)          EDD:   08/04/16
Anatomy

Cranium:               Appears normal         Aortic Arch:            Previously seen
Cavum:                 Previously seen        Ductal Arch:            Not well visualized
Ventricles:            Previously seen        Diaphragm:              Previously seen
Choroid Plexus:        Not well visualized    Stomach:                Appears normal, left
sided
Cerebellum:            Not well visualized    Abdomen:                Appears normal
Posterior Fossa:       Not well visualized    Abdominal Wall:         Not well visualized
Nuchal Fold:           Not applicable (>20    Cord Vessels:           Previously seen
wks GA)
Face:                  Orbits and profile     Kidneys:                Appear normal
previously seen
Lips:                  Previously seen        Bladder:                Appears normal
Thoracic:              Appears normal         Spine:                  Limited views
previously seen
Heart:                 Previously seen        Upper Extremities:      Previously seen
RVOT:                  Previously seen        Lower Extremities:      Previously seen
LVOT:                  Previously seen

Other:  Male gender. Nasal bone previously visualized. Technically difficult
due to advanced gestational age.
Cervix Uterus Adnexa

Cervix
Not visualized (advanced GA >66wks)

Uterus
No abnormality visualized.

Left Ovary
Not visualized.
Right Ovary
Not visualized.

Adnexa:       No abnormality visualized. No adnexal mass
visualized.
Impression

Singleton intrauterine pregnancy at 40+4 weeks, here for
growth evaluation and BPP
Interval review of the anatomy shows no sonographic
markers for aneuploidy or structural anomalies
Amniotic fluid volume is normal with an AFI of 13cm
BPP [DATE]/
Estimated fetal weight is 3888g which is growth in the 88th
percentile
Recommendations

Follow-up ultrasounds as clinically indicated. Would repeat
BPP in 1 week if not delivered

## 2022-03-30 ENCOUNTER — Other Ambulatory Visit (HOSPITAL_COMMUNITY): Payer: Self-pay

## 2023-03-07 ENCOUNTER — Emergency Department: Admit: 2023-03-07 | Discharge: 2023-03-07 | Payer: Medicaid Other

## 2023-03-07 ENCOUNTER — Inpatient Hospital Stay: Admit: 2023-03-07 | Discharge: 2023-03-07

## 2023-03-07 DIAGNOSIS — R1013 Epigastric pain: Secondary | ICD-10-CM

## 2023-03-07 DIAGNOSIS — T8332XA Displacement of intrauterine contraceptive device, initial encounter: Secondary | ICD-10-CM

## 2023-03-07 DIAGNOSIS — Z880 Allergy status to penicillin: Secondary | ICD-10-CM

## 2023-03-07 DIAGNOSIS — K529 Noninfective gastroenteritis and colitis, unspecified: Principal | ICD-10-CM

## 2023-03-07 MED ORDER — ONDANSETRON 4 MG PO TBDP
4 mg | Freq: Three times a day (TID) | ORAL | 0 refills | Status: CP | PRN
Start: 2023-03-07 — End: ?

## 2023-03-07 MED ORDER — KETOROLAC TROMETHAMINE 15 MG/ML IJ SOLN
15 mg | Freq: Once | INTRAVENOUS | Status: CP
Start: 2023-03-07 — End: ?

## 2023-03-07 MED ORDER — EPINEPHRINE 0.3 MG/0.3ML IJ SOAJ
.3 mg | Freq: Once | INTRAMUSCULAR | 0 refills | Status: CP
Start: 2023-03-07 — End: ?

## 2023-03-07 MED ORDER — IOHEXOL 350 MG/ML IV SOLN SH
100 mL | Freq: Once | INTRAVENOUS | Status: CP
Start: 2023-03-07 — End: ?

## 2023-03-07 MED ORDER — FAMOTIDINE 20 MG PO TABS
20 mg | Freq: Two times a day (BID) | ORAL | 0 refills | Status: CP
Start: 2023-03-07 — End: ?

## 2023-03-07 MED ORDER — FAMOTIDINE (PF) 20 MG/2ML IV SOLN
20 mg | Freq: Once | INTRAVENOUS | Status: CP
Start: 2023-03-07 — End: ?

## 2023-03-07 MED ORDER — METRONIDAZOLE 500 MG PO TABS
500 mg | Freq: Two times a day (BID) | ORAL | 0 refills | Status: CP
Start: 2023-03-07 — End: ?

## 2023-03-07 MED ORDER — CIPROFLOXACIN HCL 500 MG PO TABS
500 mg | Freq: Two times a day (BID) | ORAL | 0 refills | 7.00 days | Status: CP
Start: 2023-03-07 — End: ?

## 2023-03-07 MED ORDER — METOCLOPRAMIDE HCL 5 MG/ML IJ SOLN
10 mg | Freq: Once | INTRAVENOUS | Status: CP
Start: 2023-03-07 — End: ?

## 2023-03-07 MED ORDER — METHYLPREDNISOLONE SODIUM SUCC 125 MG IJ SOLR CUSTOM SH
125 mg | Freq: Once | INTRAVENOUS | Status: CP
Start: 2023-03-07 — End: ?

## 2023-03-07 MED ORDER — DIPHENHYDRAMINE HCL 50 MG/ML IJ SOLN
25 mg | Freq: Once | INTRAVENOUS | Status: CP
Start: 2023-03-07 — End: ?
